# Patient Record
Sex: Male | Born: 1950 | Race: White | Hispanic: No | Marital: Married | Smoking: Former smoker
Health system: Southern US, Community
[De-identification: ages and names within clinical notes are randomized; demographics above are authoritative.]

## PROBLEM LIST (undated history)

## (undated) DIAGNOSIS — I10 Essential (primary) hypertension: Secondary | ICD-10-CM

## (undated) DIAGNOSIS — E7800 Pure hypercholesterolemia, unspecified: Secondary | ICD-10-CM

---

## 1999-05-31 HISTORY — PX: SHOULDER CLOSED REDUCTION: SHX1051

## 2010-10-07 ENCOUNTER — Other Ambulatory Visit (HOSPITAL_COMMUNITY): Payer: Self-pay | Admitting: Internal Medicine

## 2010-10-07 ENCOUNTER — Ambulatory Visit (HOSPITAL_COMMUNITY)
Admission: RE | Admit: 2010-10-07 | Discharge: 2010-10-07 | Disposition: A | Discharge status: Home / Self Care | Payer: PRIVATE HEALTH INSURANCE | Source: Ambulatory Visit | Attending: Internal Medicine | Admitting: Internal Medicine

## 2010-10-07 DIAGNOSIS — R079 Chest pain, unspecified: Secondary | ICD-10-CM | POA: Insufficient documentation

## 2010-10-07 DIAGNOSIS — I10 Essential (primary) hypertension: Secondary | ICD-10-CM | POA: Insufficient documentation

## 2010-10-07 DIAGNOSIS — R918 Other nonspecific abnormal finding of lung field: Secondary | ICD-10-CM | POA: Insufficient documentation

## 2011-04-15 ENCOUNTER — Telehealth: Payer: Self-pay

## 2011-04-15 ENCOUNTER — Other Ambulatory Visit: Payer: Self-pay

## 2011-04-15 DIAGNOSIS — R195 Other fecal abnormalities: Secondary | ICD-10-CM

## 2011-04-15 NOTE — Telephone Encounter (Signed)
Ok as is

## 2011-04-15 NOTE — Telephone Encounter (Signed)
Gastroenterology Pre-Procedure Form    Request Date: 04/15/2011       Requesting Physician: Dr. Ouida Sills     PATIENT INFORMATION:  Jeremy Sweeney is a 60 y.o., male (DOB=13-Jan-1951).  PROCEDURE: Procedure(s) requested: colonoscopy Procedure Reason: HEME POSITIVE  PATIENT REVIEW QUESTIONS: The patient reports the following:   1. Diabetes Melitis: no 2. Joint replacements in the past 12 months: no 3. Major health problems in the past 3 months: no 4. Has an artificial valve or MVP:no 5. Has been advised in past to take antibiotics in advance of a procedure like teeth cleaning: no}    MEDICATIONS & ALLERGIES:    Patient reports the following regarding taking any blood thinners:   Plavix? no Aspirin ? Yes Coumadin?  no  Patient confirms/reports the following medications:  Current Outpatient Prescriptions  Medication Sig Dispense Refill  . aspirin 81 MG tablet Take 81 mg by mouth daily.        Marland Kitchen losartan (COZAAR) 100 MG tablet Take 100 mg by mouth daily.        . naproxen sodium (ANAPROX) 220 MG tablet Take 220 mg by mouth 2 (two) times daily with a meal. Only as needed       . penicillin v potassium (VEETID) 500 MG tablet Take 500 mg by mouth 4 (four) times daily.        . simvastatin (ZOCOR) 10 MG tablet Take 10 mg by mouth at bedtime.        . NON FORMULARY LORTAB  7.5/750    AS NEEDED FROM THE DENTIST ( PT SAYS HE HAS ONLY TAKEN A COUPLE)         Patient confirms/reports the following allergies:  No Known Allergies  Patient is appropriate to schedule for requested procedure(s): yes  AUTHORIZATION INFORMATION Primary Insurance:   ID #:  Group #:  Pre-Cert / Auth required: Pre-Cert / Auth #:   Secondary Insurance:   ID #:   Group #:  Pre-Cert / Auth required:  Pre-Cert / Auth #:   No orders of the defined types were placed in this encounter.    SCHEDULE INFORMATION: Procedure has been scheduled as follows:  Date: 04/20/2011       Time: 2:15 PM  Location: [facility] Short Stay  This Gastroenterology Pre-Precedure Form is being routed to the following provider(s) for review: R. Roetta Sessions, MD   Rx and instructions faxed to [facility]/ Pt aware

## 2011-04-18 ENCOUNTER — Encounter (HOSPITAL_COMMUNITY): Payer: Self-pay | Admitting: Pharmacy Technician

## 2011-04-19 MED ORDER — SODIUM CHLORIDE 0.45 % IV SOLN
Freq: Once | INTRAVENOUS | Status: AC
  Administered 2011-04-20: 15:00:00 via INTRAVENOUS

## 2011-04-20 ENCOUNTER — Encounter (HOSPITAL_COMMUNITY): Payer: Self-pay

## 2011-04-20 ENCOUNTER — Encounter (HOSPITAL_COMMUNITY)
Admission: RE | Disposition: A | Discharge status: Home / Self Care | Payer: Self-pay | Source: Ambulatory Visit | Attending: Internal Medicine

## 2011-04-20 ENCOUNTER — Ambulatory Visit (HOSPITAL_COMMUNITY)
Admission: RE | Admit: 2011-04-20 | Discharge: 2011-04-20 | Disposition: A | Discharge status: Home / Self Care | Payer: No Typology Code available for payment source | Source: Ambulatory Visit | Attending: Internal Medicine | Admitting: Internal Medicine

## 2011-04-20 ENCOUNTER — Other Ambulatory Visit: Payer: Self-pay | Admitting: Internal Medicine

## 2011-04-20 DIAGNOSIS — D129 Benign neoplasm of anus and anal canal: Secondary | ICD-10-CM

## 2011-04-20 DIAGNOSIS — R195 Other fecal abnormalities: Secondary | ICD-10-CM

## 2011-04-20 DIAGNOSIS — Z79899 Other long term (current) drug therapy: Secondary | ICD-10-CM | POA: Insufficient documentation

## 2011-04-20 DIAGNOSIS — I10 Essential (primary) hypertension: Secondary | ICD-10-CM | POA: Insufficient documentation

## 2011-04-20 DIAGNOSIS — K5730 Diverticulosis of large intestine without perforation or abscess without bleeding: Secondary | ICD-10-CM

## 2011-04-20 DIAGNOSIS — D128 Benign neoplasm of rectum: Secondary | ICD-10-CM

## 2011-04-20 DIAGNOSIS — K921 Melena: Secondary | ICD-10-CM | POA: Insufficient documentation

## 2011-04-20 DIAGNOSIS — K648 Other hemorrhoids: Secondary | ICD-10-CM | POA: Insufficient documentation

## 2011-04-20 DIAGNOSIS — D126 Benign neoplasm of colon, unspecified: Secondary | ICD-10-CM

## 2011-04-20 HISTORY — DX: Essential (primary) hypertension: I10

## 2011-04-20 HISTORY — PX: COLONOSCOPY: SHX5424

## 2011-04-20 LAB — CBC
HCT: 43.3 % (ref 39.0–52.0)
MCH: 26.6 pg (ref 26.0–34.0)
MCV: 81.5 fL (ref 78.0–100.0)
RBC: 5.31 MIL/uL (ref 4.22–5.81)
RDW: 13.2 % (ref 11.5–15.5)
WBC: 10.1 10*3/uL (ref 4.0–10.5)

## 2011-04-20 SURGERY — COLONOSCOPY
Anesthesia: Moderate Sedation

## 2011-04-20 MED ORDER — MIDAZOLAM HCL 5 MG/5ML IJ SOLN
INTRAMUSCULAR | Status: DC | PRN
  Administered 2011-04-20: 1 mg via INTRAVENOUS
  Administered 2011-04-20: 2 mg via INTRAVENOUS
  Administered 2011-04-20: 1 mg via INTRAVENOUS

## 2011-04-20 MED ORDER — MEPERIDINE HCL 100 MG/ML IJ SOLN
INTRAMUSCULAR | Status: DC | PRN
  Administered 2011-04-20: 50 mg via INTRAVENOUS
  Administered 2011-04-20 (×2): 25 mg via INTRAVENOUS

## 2011-04-20 MED ORDER — SIMETHICONE 40 MG/0.6ML PO SUSP
ORAL | Status: DC | PRN
  Administered 2011-04-20: 15:00:00

## 2011-04-20 MED ORDER — MEPERIDINE HCL 100 MG/ML IJ SOLN
INTRAMUSCULAR | Status: AC
  Filled 2011-04-20: qty 2

## 2011-04-20 MED ORDER — MIDAZOLAM HCL 5 MG/5ML IJ SOLN
INTRAMUSCULAR | Status: AC
  Filled 2011-04-20: qty 10

## 2011-04-20 NOTE — H&P (Signed)
  Primary Care Physician:  Carylon Perches, MD Primary Gastroenterologist:  Dr.   Pre-Procedure History & Physical: HPI:  Jeremy Sweeney is a 60 y.o. male here for colonoscopy given he is strongly Hemoccult positive stool last week in Dr. Alonza Smoker office. Patient has not seen any rectal bleeding or melena in fact, he's devoid any GI symptoms. Specifically, no abdominal pain or change in bowel habits no upper GI tract symptoms such as odynaphagia , dysphagia, early satiety, nausea,  vomiting or reflux symptoms. No prior colonoscopy. No family history of GI malignancy including colon cancer colon polyps. No prior history of any gastrointestinal disorders per patient report. Takes occasional Naprosyn and daily 81 mg aspirin.  Past Medical History  Diagnosis Date  . Hypertension     Past Surgical History  Procedure Date  . Shoulder closed reduction     Prior to Admission medications   Medication Sig Start Date End Date Taking? Authorizing Provider  aspirin EC 81 MG tablet Take 81 mg by mouth daily.     Yes Historical Provider, MD  HYDROcodone-acetaminophen (VICODIN ES) 7.5-750 MG per tablet Take 1 tablet by mouth every 6 (six) hours as needed. pain    Yes Historical Provider, MD  losartan (COZAAR) 100 MG tablet Take 100 mg by mouth daily.     Yes Historical Provider, MD  penicillin v potassium (VEETID) 500 MG tablet Take 500 mg by mouth 4 (four) times daily.     Yes Historical Provider, MD  simvastatin (ZOCOR) 10 MG tablet Take 10 mg by mouth at bedtime.     Yes Historical Provider, MD  naproxen sodium (ANAPROX) 220 MG tablet Take 220 mg by mouth 2 (two) times daily with a meal. For pain    Historical Provider, MD    Allergies as of 04/15/2011  . (No Known Allergies)    History reviewed. No pertinent family history.  History   Social History  . Marital Status: Unknown    Spouse Name: N/A    Number of Children: N/A  . Years of Education: N/A   Occupational History  . Not on file.    Social History Main Topics  . Smoking status: Former Games developer  . Smokeless tobacco: Not on file  . Alcohol Use: Yes  . Drug Use: No  . Sexually Active:    Other Topics Concern  . Not on file   Social History Narrative  . No narrative on file    Review of Systems: See HPI, otherwise negative ROS  Physical Exam: BP 137/82  Pulse 106  Temp(Src) 97.9 F (36.6 C) (Oral)  Resp 22  Ht 5' 7.5" (1.715 m)  Wt 180 lb (81.647 kg)  BMI 27.78 kg/m2  SpO2 98% General:   Alert,  Well-developed, well-nourished, pleasant and cooperative in NAD Head:  Normocephalic and atraumatic. Mouth:  No deformity or lesions, dentition normal. Neck:  Supple; no masses or thyromegaly. Lungs:  Clear throughout to auscultation.   No wheezes, crackles, or rhonchi. No acute distress. Heart:  Regular rate and rhythm; no murmurs, clicks, rubs,  or gallops. Abdomen:   Positive bowel sounds. Soft nontender without appreciable mass or organomegaly     Impression/Plan:    60 year old gentleman Hemoccult positive stool; otherwise no GI symptoms. No prior colonoscopy.  Recommendations: Diagnostic colonoscopy today.  Risks, benefits, limitations, alternatives and imponderables have been reviewed with the patient. Questions have been answered. All parties are agreeable.

## 2011-04-26 NOTE — Progress Notes (Signed)
Quick Note:  Tried to call pt- LMOM ______ 

## 2011-04-27 NOTE — Progress Notes (Signed)
Results Cc to PCP  

## 2011-04-28 ENCOUNTER — Encounter (HOSPITAL_COMMUNITY): Payer: Self-pay | Admitting: Internal Medicine

## 2011-05-03 ENCOUNTER — Encounter: Payer: Self-pay | Admitting: Internal Medicine

## 2015-04-27 DIAGNOSIS — E119 Type 2 diabetes mellitus without complications: Secondary | ICD-10-CM | POA: Diagnosis not present

## 2015-04-27 DIAGNOSIS — Z79899 Other long term (current) drug therapy: Secondary | ICD-10-CM | POA: Diagnosis not present

## 2015-04-27 DIAGNOSIS — E785 Hyperlipidemia, unspecified: Secondary | ICD-10-CM | POA: Diagnosis not present

## 2015-04-27 DIAGNOSIS — I10 Essential (primary) hypertension: Secondary | ICD-10-CM | POA: Diagnosis not present

## 2015-05-05 DIAGNOSIS — I10 Essential (primary) hypertension: Secondary | ICD-10-CM | POA: Diagnosis not present

## 2015-05-05 DIAGNOSIS — Z23 Encounter for immunization: Secondary | ICD-10-CM | POA: Diagnosis not present

## 2015-05-05 DIAGNOSIS — R7301 Impaired fasting glucose: Secondary | ICD-10-CM | POA: Diagnosis not present

## 2015-05-05 DIAGNOSIS — E785 Hyperlipidemia, unspecified: Secondary | ICD-10-CM | POA: Diagnosis not present

## 2015-05-05 DIAGNOSIS — Z6825 Body mass index (BMI) 25.0-25.9, adult: Secondary | ICD-10-CM | POA: Diagnosis not present

## 2015-09-10 DIAGNOSIS — N23 Unspecified renal colic: Secondary | ICD-10-CM | POA: Diagnosis not present

## 2015-09-10 DIAGNOSIS — Z6825 Body mass index (BMI) 25.0-25.9, adult: Secondary | ICD-10-CM | POA: Diagnosis not present

## 2015-09-17 ENCOUNTER — Other Ambulatory Visit (HOSPITAL_COMMUNITY): Payer: Self-pay | Admitting: Internal Medicine

## 2015-09-17 DIAGNOSIS — R319 Hematuria, unspecified: Secondary | ICD-10-CM | POA: Diagnosis not present

## 2015-09-17 DIAGNOSIS — N23 Unspecified renal colic: Secondary | ICD-10-CM | POA: Diagnosis not present

## 2015-09-18 ENCOUNTER — Ambulatory Visit (HOSPITAL_COMMUNITY)
Admission: RE | Admit: 2015-09-18 | Discharge: 2015-09-18 | Disposition: A | Discharge status: Home / Self Care | Payer: Medicare HMO | Source: Ambulatory Visit | Attending: Internal Medicine | Admitting: Internal Medicine

## 2015-09-18 DIAGNOSIS — N400 Enlarged prostate without lower urinary tract symptoms: Secondary | ICD-10-CM | POA: Diagnosis not present

## 2015-09-18 DIAGNOSIS — N1330 Unspecified hydronephrosis: Secondary | ICD-10-CM | POA: Insufficient documentation

## 2015-09-18 DIAGNOSIS — R109 Unspecified abdominal pain: Secondary | ICD-10-CM | POA: Insufficient documentation

## 2015-09-18 DIAGNOSIS — N132 Hydronephrosis with renal and ureteral calculous obstruction: Secondary | ICD-10-CM | POA: Diagnosis not present

## 2015-09-18 DIAGNOSIS — N23 Unspecified renal colic: Secondary | ICD-10-CM

## 2015-09-18 DIAGNOSIS — K5730 Diverticulosis of large intestine without perforation or abscess without bleeding: Secondary | ICD-10-CM | POA: Insufficient documentation

## 2015-09-18 DIAGNOSIS — R319 Hematuria, unspecified: Secondary | ICD-10-CM

## 2015-09-18 DIAGNOSIS — N201 Calculus of ureter: Secondary | ICD-10-CM | POA: Diagnosis not present

## 2015-09-18 DIAGNOSIS — N200 Calculus of kidney: Secondary | ICD-10-CM | POA: Insufficient documentation

## 2015-09-24 ENCOUNTER — Other Ambulatory Visit (HOSPITAL_COMMUNITY): Payer: Self-pay | Admitting: Internal Medicine

## 2015-09-24 DIAGNOSIS — R918 Other nonspecific abnormal finding of lung field: Secondary | ICD-10-CM

## 2015-09-30 ENCOUNTER — Ambulatory Visit (HOSPITAL_COMMUNITY)
Admission: RE | Admit: 2015-09-30 | Discharge: 2015-09-30 | Disposition: A | Discharge status: Home / Self Care | Payer: Medicare HMO | Source: Ambulatory Visit | Attending: Internal Medicine | Admitting: Internal Medicine

## 2015-09-30 DIAGNOSIS — R918 Other nonspecific abnormal finding of lung field: Secondary | ICD-10-CM

## 2015-09-30 DIAGNOSIS — I2510 Atherosclerotic heart disease of native coronary artery without angina pectoris: Secondary | ICD-10-CM | POA: Insufficient documentation

## 2015-10-09 ENCOUNTER — Ambulatory Visit (INDEPENDENT_AMBULATORY_CARE_PROVIDER_SITE_OTHER): Payer: Medicare HMO | Admitting: Urology

## 2015-10-09 ENCOUNTER — Other Ambulatory Visit: Payer: Self-pay | Admitting: Urology

## 2015-10-09 ENCOUNTER — Ambulatory Visit (HOSPITAL_COMMUNITY)
Admission: RE | Admit: 2015-10-09 | Discharge: 2015-10-09 | Disposition: A | Discharge status: Home / Self Care | Payer: Medicare HMO | Source: Ambulatory Visit | Attending: Urology | Admitting: Urology

## 2015-10-09 DIAGNOSIS — N201 Calculus of ureter: Secondary | ICD-10-CM | POA: Diagnosis not present

## 2015-10-09 DIAGNOSIS — Z87442 Personal history of urinary calculi: Secondary | ICD-10-CM

## 2015-10-09 DIAGNOSIS — R109 Unspecified abdominal pain: Secondary | ICD-10-CM | POA: Diagnosis not present

## 2015-10-28 ENCOUNTER — Other Ambulatory Visit: Payer: Self-pay | Admitting: Urology

## 2015-10-28 ENCOUNTER — Ambulatory Visit (INDEPENDENT_AMBULATORY_CARE_PROVIDER_SITE_OTHER): Payer: Medicare HMO | Admitting: Urology

## 2015-10-28 ENCOUNTER — Ambulatory Visit (HOSPITAL_COMMUNITY)
Admission: RE | Admit: 2015-10-28 | Discharge: 2015-10-28 | Disposition: A | Discharge status: Home / Self Care | Payer: Medicare HMO | Source: Ambulatory Visit | Attending: Urology | Admitting: Urology

## 2015-10-28 DIAGNOSIS — N200 Calculus of kidney: Secondary | ICD-10-CM | POA: Diagnosis not present

## 2015-10-28 DIAGNOSIS — N201 Calculus of ureter: Secondary | ICD-10-CM | POA: Insufficient documentation

## 2015-11-02 ENCOUNTER — Other Ambulatory Visit: Payer: Self-pay

## 2015-11-02 DIAGNOSIS — N201 Calculus of ureter: Secondary | ICD-10-CM

## 2015-11-04 ENCOUNTER — Telehealth: Payer: Self-pay | Admitting: Urology

## 2015-11-04 NOTE — Telephone Encounter (Signed)
Pt advised of pre op date/time and sx date. Pre op: 11/09/15 @ 9:00am Sx: 11/11/15 with Dr Alyson Ingles @ [facility]. Post op: 11/25/15 @ 10:30am  No authorization is required for CPT: [id number] with [employer] [phone]) REF# [id number].

## 2015-11-05 DIAGNOSIS — R911 Solitary pulmonary nodule: Secondary | ICD-10-CM | POA: Diagnosis not present

## 2015-11-05 DIAGNOSIS — I10 Essential (primary) hypertension: Secondary | ICD-10-CM | POA: Diagnosis not present

## 2015-11-05 DIAGNOSIS — I714 Abdominal aortic aneurysm, without rupture: Secondary | ICD-10-CM | POA: Diagnosis not present

## 2015-11-06 NOTE — Patient Instructions (Signed)
Jeremy Sweeney  11/06/2015     @PREFPERIOPPHARMACY @   Your procedure is scheduled on 11/11/2015.  Report to [facility] at 12:30 A.M.  Call this number if you have problems the morning of surgery:  [phone]   Remember:  Do not eat food or drink liquids after midnight.  Take these medicines the morning of surgery with A SIP OF WATER Losartan, Flomax   Do not wear jewelry, make-up or nail polish.  Do not wear lotions, powders, or perfumes.  You may wear deodorant.  Do not shave 48 hours prior to surgery.  Men may shave face and neck.  Do not bring valuables to the hospital.  [facility] is not responsible for any belongings or valuables.  Contacts, dentures or bridgework may not be worn into surgery.  Leave your suitcase in the car.  After surgery it may be brought to your room.  For patients admitted to the hospital, discharge time will be determined by your treatment team.  Patients discharged the day of surgery will not be allowed to drive home.    Please read over the following fact sheets that you were given. Surgical Site Infection Prevention and Anesthesia Post-op Instructions     PATIENT INSTRUCTIONS POST-ANESTHESIA  IMMEDIATELY FOLLOWING SURGERY:  Do not drive or operate machinery for the first twenty four hours after surgery.  Do not make any important decisions for twenty four hours after surgery or while taking narcotic pain medications or sedatives.  If you develop intractable nausea and vomiting or a severe headache please notify your doctor immediately.  FOLLOW-UP:  Please make an appointment with your surgeon as instructed. You do not need to follow up with anesthesia unless specifically instructed to do so.  WOUND CARE INSTRUCTIONS (if applicable):  Keep a dry clean dressing on the anesthesia/puncture wound site if there is drainage.  Once the wound has quit draining you may leave it open to air.  Generally you should leave the bandage intact for twenty  four hours unless there is drainage.  If the epidural site drains for more than 36-48 hours please call the anesthesia department.  QUESTIONS?:  Please feel free to call your physician or the hospital operator if you have any questions, and they will be happy to assist you.      Ureteral Stent Implantation Ureteral stent implantation is the implantation of a soft plastic tube with multiple holes into the tube that drains urine from your kidney to your bladder (ureter). The stent helps drain your kidney when there is a blockage of the flow of urine in your ureter. The stent has a coil on each end to keep it from falling out. One end stays in the kidney. The other end stays in the bladder. It is most often taken out after any blockage has been removed or your ureter has healed. Short-term stents have a string attached to make removal quite easy. Removal of a short-term stent can be done in your health care provider's office or by you at home. Long-term stents need to be changed every few months. LET [facility] CARE PROVIDER KNOW ABOUT:  Any allergies you have.  All medicines you are taking, including vitamins, herbs, eye drops, creams, and over-the-counter medicines.  Previous problems you or members of your family have had with the use of anesthetics.  Any blood disorders you have.  Previous surgeries you have had.  Medical conditions you have. RISKS AND COMPLICATIONS Generally, ureteral stent implantation is a  safe procedure. However, as with any procedure, complications can occur. Possible complications include:  Movement of the stent away from where it was originally placed (migration). This may affect the ability of the stent to properly drain your kidney. If migration of the stent occurs, the stent may need to be replaced or repositioned.  Perforation of the ureter.  Infection. BEFORE THE PROCEDURE  You may be asked to wash your genital area with sterile soap the morning of your  procedure.  You may be given an oral antibiotic which you should take with a sip of water as prescribed by your health care provider.  You may be asked to not eat or drink for 8 hours before the surgery. PROCEDURE  First you will be given an anesthetic so you do not feel pain during the procedure.  Your health care provider will insert a special lighted instrument called a cystoscope into your bladder. This allows your health care provider to see the opening to your ureter.  A thin wire is carefully threaded into your bladder and up the ureter. The stent is inserted over the wire and the wire is then removed.  Your bladder will be emptied of urine. AFTER THE PROCEDURE You will be taken to a recovery room until it is okay for you to go home.   This information is not intended to replace advice given to you by your health care provider. Make sure you discuss any questions you have with your health care provider.   Document Released: 05/13/2000 Document Revised: 05/21/2013 Document Reviewed: 11/28/2014 Elsevier Interactive Patient Education [employer]. Cystoscopy Cystoscopy is a procedure that is used to help your caregiver diagnose and sometimes treat conditions that affect your lower urinary tract. Your lower urinary tract includes your bladder and the tube through which urine passes from your bladder out of your body (urethra). Cystoscopy is performed with a thin, tube-shaped instrument (cystoscope). The cystoscope has lenses and a light at the end so that your caregiver can see inside your bladder. The cystoscope is inserted at the entrance of your urethra. Your caregiver guides it through your urethra and into your bladder. There are two main types of cystoscopy:  Flexible cystoscopy (with a flexible cystoscope).  Rigid cystoscopy (with a rigid cystoscope). Cystoscopy may be recommended for many conditions, including:  Urinary tract infections.  Blood in your urine  (hematuria).  Loss of bladder control (urinary incontinence) or overactive bladder.  Unusual cells found in a urine sample.  Urinary blockage.  Painful urination. Cystoscopy may also be done to remove a sample of your tissue to be checked under a microscope (biopsy). It may also be done to remove or destroy bladder stones. LET YOUR CAREGIVER KNOW ABOUT:  Allergies to food or medicine.  Medicines taken, including vitamins, herbs, eyedrops, over-the-counter medicines, and creams.  Use of steroids (by mouth or creams).  Previous problems with anesthetics or numbing medicines.  History of bleeding problems or blood clots.  Previous surgery.  Other health problems, including diabetes and kidney problems.  Possibility of pregnancy, if this applies. PROCEDURE The area around the opening to your urethra will be cleaned. A medicine to numb your urethra (local anesthetic) is used. If a tissue sample or stone is removed during the procedure, you may be given a medicine to make you sleep (general anesthetic). Your caregiver will gently insert the tip of the cystoscope into your urethra. The cystoscope will be slowly glided through your urethra and into your bladder. Sterile  fluid will flow through the cystoscope and into your bladder. The fluid will expand and stretch your bladder. This gives your caregiver a better view of your bladder walls. The procedure lasts about 15-20 minutes. AFTER THE PROCEDURE If a local anesthetic is used, you will be allowed to go home as soon as you are ready. If a general anesthetic is used, you will be taken to a recovery area until you are stable. You may have temporary bleeding and burning on urination.   This information is not intended to replace advice given to you by your health care provider. Make sure you discuss any questions you have with your health care provider.   Document Released: 05/13/2000 Document Revised: 06/06/2014 Document Reviewed:  11/07/2011 Elsevier Interactive Patient Education [employer].

## 2015-11-09 ENCOUNTER — Encounter (HOSPITAL_COMMUNITY): Payer: Self-pay

## 2015-11-09 ENCOUNTER — Encounter (HOSPITAL_COMMUNITY)
Admission: RE | Admit: 2015-11-09 | Discharge: 2015-11-09 | Disposition: A | Discharge status: Home / Self Care | Payer: Medicare HMO | Source: Ambulatory Visit | Attending: Urology | Admitting: Urology

## 2015-11-09 ENCOUNTER — Other Ambulatory Visit: Payer: Self-pay

## 2015-11-09 DIAGNOSIS — I10 Essential (primary) hypertension: Secondary | ICD-10-CM | POA: Diagnosis not present

## 2015-11-09 DIAGNOSIS — N201 Calculus of ureter: Secondary | ICD-10-CM | POA: Diagnosis not present

## 2015-11-09 DIAGNOSIS — Z01812 Encounter for preprocedural laboratory examination: Secondary | ICD-10-CM | POA: Diagnosis not present

## 2015-11-09 DIAGNOSIS — Z87891 Personal history of nicotine dependence: Secondary | ICD-10-CM | POA: Diagnosis not present

## 2015-11-09 DIAGNOSIS — Z01810 Encounter for preprocedural cardiovascular examination: Secondary | ICD-10-CM | POA: Diagnosis not present

## 2015-11-09 HISTORY — DX: Pure hypercholesterolemia, unspecified: E78.00

## 2015-11-09 LAB — CBC WITH DIFFERENTIAL/PLATELET
BASOS PCT: 1 %
Basophils Absolute: 0.1 10*3/uL (ref 0.0–0.1)
Eosinophils Absolute: 0.2 10*3/uL (ref 0.0–0.7)
Eosinophils Relative: 3 %
HEMATOCRIT: 37.9 % — AB (ref 39.0–52.0)
Hemoglobin: 12.6 g/dL — ABNORMAL LOW (ref 13.0–17.0)
LYMPHS ABS: 1.4 10*3/uL (ref 0.7–4.0)
Lymphocytes Relative: 22 %
MCH: 27.4 pg (ref 26.0–34.0)
MCHC: 33.2 g/dL (ref 30.0–36.0)
MCV: 82.4 fL (ref 78.0–100.0)
MONO ABS: 0.3 10*3/uL (ref 0.1–1.0)
MONOS PCT: 5 %
NEUTROS ABS: 4.2 10*3/uL (ref 1.7–7.7)
Neutrophils Relative %: 69 %
Platelets: 188 10*3/uL (ref 150–400)
RBC: 4.6 MIL/uL (ref 4.22–5.81)
RDW: 13.3 % (ref 11.5–15.5)
WBC: 6.2 10*3/uL (ref 4.0–10.5)

## 2015-11-09 LAB — BASIC METABOLIC PANEL
Anion gap: 3 — ABNORMAL LOW (ref 5–15)
BUN: 20 mg/dL (ref 6–20)
CALCIUM: 9.5 mg/dL (ref 8.9–10.3)
CHLORIDE: 109 mmol/L (ref 101–111)
CO2: 28 mmol/L (ref 22–32)
CREATININE: 1.15 mg/dL (ref 0.61–1.24)
GFR calc Af Amer: >60 mL/min (ref >60)
GFR calc non Af Amer: >60 mL/min (ref >60)
GLUCOSE: 113 mg/dL — AB (ref 65–99)
Potassium: 4.1 mmol/L (ref 3.5–5.1)
Sodium: 140 mmol/L (ref 135–145)

## 2015-11-09 NOTE — Pre-Procedure Instructions (Signed)
Patient given information to sign up for my chart at home. 

## 2015-11-11 ENCOUNTER — Encounter (HOSPITAL_COMMUNITY): Payer: Self-pay | Admitting: *Deleted

## 2015-11-11 ENCOUNTER — Ambulatory Visit (HOSPITAL_COMMUNITY)
Admission: RE | Admit: 2015-11-11 | Discharge: 2015-11-11 | Disposition: A | Discharge status: Home / Self Care | Payer: Medicare HMO | Source: Ambulatory Visit | Attending: Urology | Admitting: Urology

## 2015-11-11 ENCOUNTER — Ambulatory Visit (HOSPITAL_COMMUNITY): Payer: Medicare HMO | Admitting: Anesthesiology

## 2015-11-11 ENCOUNTER — Encounter (HOSPITAL_COMMUNITY)
Admission: RE | Disposition: A | Discharge status: Home / Self Care | Payer: Self-pay | Source: Ambulatory Visit | Attending: Urology

## 2015-11-11 ENCOUNTER — Ambulatory Visit (HOSPITAL_COMMUNITY): Payer: Medicare HMO

## 2015-11-11 DIAGNOSIS — I10 Essential (primary) hypertension: Secondary | ICD-10-CM | POA: Insufficient documentation

## 2015-11-11 DIAGNOSIS — Z01812 Encounter for preprocedural laboratory examination: Secondary | ICD-10-CM | POA: Diagnosis not present

## 2015-11-11 DIAGNOSIS — Z01810 Encounter for preprocedural cardiovascular examination: Secondary | ICD-10-CM | POA: Insufficient documentation

## 2015-11-11 DIAGNOSIS — Z87891 Personal history of nicotine dependence: Secondary | ICD-10-CM | POA: Diagnosis not present

## 2015-11-11 DIAGNOSIS — N201 Calculus of ureter: Secondary | ICD-10-CM

## 2015-11-11 DIAGNOSIS — N200 Calculus of kidney: Secondary | ICD-10-CM | POA: Diagnosis not present

## 2015-11-11 HISTORY — PX: CYSTOSCOPY WITH STENT PLACEMENT: SHX5790

## 2015-11-11 HISTORY — PX: CYSTOSCOPY/RETROGRADE/URETEROSCOPY/STONE EXTRACTION WITH BASKET: SHX5317

## 2015-11-11 SURGERY — CYSTOSCOPY, WITH CALCULUS REMOVAL USING BASKET
Anesthesia: General

## 2015-11-11 MED ORDER — STERILE WATER FOR IRRIGATION IR SOLN
Status: DC | PRN
  Administered 2015-11-11: 3000 mL

## 2015-11-11 MED ORDER — DIATRIZOATE MEGLUMINE 30 % UR SOLN
URETHRAL | Status: AC
Start: 2015-11-11 — End: 2015-11-11
  Filled 2015-11-11: qty 300

## 2015-11-11 MED ORDER — EPHEDRINE SULFATE 50 MG/ML IJ SOLN
INTRAMUSCULAR | Status: DC | PRN
  Administered 2015-11-11 (×5): 10 mg via INTRAVENOUS

## 2015-11-11 MED ORDER — FENTANYL CITRATE (PF) 100 MCG/2ML IJ SOLN
INTRAMUSCULAR | Status: AC
  Filled 2015-11-11: qty 2

## 2015-11-11 MED ORDER — MIDAZOLAM HCL 2 MG/2ML IJ SOLN
1.0000 mg | INTRAMUSCULAR | Status: DC | PRN
  Administered 2015-11-11: 2 mg via INTRAVENOUS

## 2015-11-11 MED ORDER — CEFAZOLIN SODIUM 1-5 GM-% IV SOLN
1.0000 g | INTRAVENOUS | Status: AC
  Administered 2015-11-11: 1 g via INTRAVENOUS
  Filled 2015-11-11: qty 50

## 2015-11-11 MED ORDER — FENTANYL CITRATE (PF) 100 MCG/2ML IJ SOLN
25.0000 ug | INTRAMUSCULAR | Status: AC | PRN
Start: 2015-11-11 — End: 2015-11-11
  Administered 2015-11-11 (×2): 25 ug via INTRAVENOUS

## 2015-11-11 MED ORDER — ONDANSETRON HCL 4 MG/2ML IJ SOLN: 4.0000 mg | Freq: Once | INTRAMUSCULAR | Status: DC | PRN

## 2015-11-11 MED ORDER — EPHEDRINE SULFATE 50 MG/ML IJ SOLN
INTRAMUSCULAR | Status: AC
  Filled 2015-11-11: qty 1

## 2015-11-11 MED ORDER — PROPOFOL 10 MG/ML IV BOLUS
INTRAVENOUS | Status: DC | PRN
  Administered 2015-11-11: 150 mg via INTRAVENOUS

## 2015-11-11 MED ORDER — LIDOCAINE HCL (PF) 1 % IJ SOLN
INTRAMUSCULAR | Status: AC
  Filled 2015-11-11: qty 5

## 2015-11-11 MED ORDER — FENTANYL CITRATE (PF) 100 MCG/2ML IJ SOLN: 25.0000 ug | INTRAMUSCULAR | Status: DC | PRN

## 2015-11-11 MED ORDER — MIDAZOLAM HCL 2 MG/2ML IJ SOLN
INTRAMUSCULAR | Status: AC
  Filled 2015-11-11: qty 2

## 2015-11-11 MED ORDER — DIATRIZOATE MEGLUMINE 30 % UR SOLN
URETHRAL | Status: DC | PRN
  Administered 2015-11-11: 100 mL

## 2015-11-11 MED ORDER — LACTATED RINGERS IV SOLN
INTRAVENOUS | Status: DC
  Administered 2015-11-11 (×2): via INTRAVENOUS

## 2015-11-11 MED ORDER — ONDANSETRON HCL 4 MG/2ML IJ SOLN
4.0000 mg | Freq: Once | INTRAMUSCULAR | Status: AC
  Administered 2015-11-11: 4 mg via INTRAVENOUS

## 2015-11-11 MED ORDER — LIDOCAINE HCL (CARDIAC) 10 MG/ML IV SOLN
INTRAVENOUS | Status: DC | PRN
  Administered 2015-11-11: 50 mg via INTRAVENOUS

## 2015-11-11 MED ORDER — PHENAZOPYRIDINE HCL 100 MG PO TABS: 100.0000 mg | ORAL_TABLET | Freq: Three times a day (TID) | ORAL | Status: DC | PRN

## 2015-11-11 MED ORDER — ONDANSETRON HCL 4 MG/2ML IJ SOLN
INTRAMUSCULAR | Status: AC
Start: 2015-11-11 — End: 2015-11-11
  Filled 2015-11-11: qty 2

## 2015-11-11 MED ORDER — OXYCODONE-ACETAMINOPHEN 5-325 MG PO TABS: 1.0000 | ORAL_TABLET | ORAL | Status: DC | PRN

## 2015-11-11 MED ORDER — FENTANYL CITRATE (PF) 100 MCG/2ML IJ SOLN
INTRAMUSCULAR | Status: DC | PRN
  Administered 2015-11-11: 100 ug via INTRAVENOUS

## 2015-11-11 MED ORDER — SODIUM CHLORIDE 0.9 % IJ SOLN
INTRAMUSCULAR | Status: AC
  Filled 2015-11-11: qty 10

## 2015-11-11 MED ORDER — SODIUM CHLORIDE 0.9 % IR SOLN
Status: DC | PRN
  Administered 2015-11-11: 3000 mL

## 2015-11-11 SURGICAL SUPPLY — 24 items
BAG DRAIN URO TABLE W/ADPT NS (DRAPE) ×3 IMPLANT
BAG HAMPER (MISCELLANEOUS) ×3 IMPLANT
CATH INTERMIT  6FR 70CM (CATHETERS) ×3 IMPLANT
CLOTH BEACON ORANGE TIMEOUT ST (SAFETY) ×3 IMPLANT
EXTRACTOR STONE NITINOL NGAGE (UROLOGICAL SUPPLIES) ×3 IMPLANT
FIBER LASER FLEXIVA 200 (UROLOGICAL SUPPLIES) IMPLANT
FIBER LASER FLEXIVA 365 (UROLOGICAL SUPPLIES) IMPLANT
GLOVE BIO SURGEON STRL SZ8 (GLOVE) ×3 IMPLANT
GLOVE BIOGEL PI IND STRL 6.5 (GLOVE) ×2 IMPLANT
GLOVE BIOGEL PI INDICATOR 6.5 (GLOVE) ×1
GLOVE EXAM NITRILE MD LF STRL (GLOVE) ×3 IMPLANT
GOWN STRL REUS W/ TWL LRG LVL3 (GOWN DISPOSABLE) ×2 IMPLANT
GOWN STRL REUS W/ TWL XL LVL3 (GOWN DISPOSABLE) ×2 IMPLANT
GOWN STRL REUS W/TWL LRG LVL3 (GOWN DISPOSABLE) ×1
GOWN STRL REUS W/TWL XL LVL3 (GOWN DISPOSABLE) ×1
GUIDEWIRE ANG ZIPWIRE 038X150 (WIRE) ×3 IMPLANT
GUIDEWIRE STR DUAL SENSOR (WIRE) IMPLANT
IV NS IRRIG 3000ML ARTHROMATIC (IV SOLUTION) ×3 IMPLANT
MANIFOLD NEPTUNE II (INSTRUMENTS) ×3 IMPLANT
PACK CYSTO (CUSTOM PROCEDURE TRAY) ×3 IMPLANT
PAD ARMBOARD 7.5X6 YLW CONV (MISCELLANEOUS) ×3 IMPLANT
STENT URET 6FRX26 CONTOUR (STENTS) ×3 IMPLANT
SYRINGE 10CC LL (SYRINGE) ×3 IMPLANT
WATER STERILE IRR 3000ML UROMA (IV SOLUTION) ×3 IMPLANT

## 2015-11-11 NOTE — Transfer of Care (Signed)
Immediate Anesthesia Transfer of Care Note  Patient: Jeremy Sweeney  Procedure(s) Performed: Procedure(s): CYSTOSCOPY/RETROGRADE/URETEROSCOPY/STONE EXTRACTION WITH BASKET (Left) CYSTOSCOPY WITH STENT PLACEMENT (Left)  Patient Location: PACU  Anesthesia Type:General  Level of Consciousness: awake, alert , oriented and patient cooperative  Airway & Oxygen Therapy: Patient Spontanous Breathing and Patient connected to face mask oxygen  Post-op Assessment: Report given to RN, Post -op Vital signs reviewed and stable and Patient moving all extremities  Post vital signs: Reviewed and stable  Last Vitals:  Filed Vitals:   11/11/15 1455 11/11/15 1500  BP: 119/73   Resp: 24 15    Last Pain: There were no vitals filed for this visit.    Patients Stated Pain Goal: 5 ([phone] [id number])  Complications: No apparent anesthesia complications

## 2015-11-11 NOTE — H&P (Signed)
Urology Admission H&P  Chief Complaint: left flank pain  History of Present Illness: [name] is a 65yo with a hx of nephrolithiasis who has failed MET. He has a 52m L UVJ calculus. Mild intermittent pain. Mild urgency and dysuria. No hematuria  Past Medical History  Diagnosis Date  . Hypertension   . Hypercholesteremia    Past Surgical History  Procedure Laterality Date  . Shoulder closed reduction  2001  . Colonoscopy  04/20/2011    Procedure: COLONOSCOPY;  Surgeon: RDaneil Dolin MD;  Location: [facility];  Service: Endoscopy;  Laterality: N/A;  2:15 PM    Home Medications:  Prescriptions prior to admission  Medication Sig Dispense Refill Last Dose  . aspirin EC 81 MG tablet Take 81 mg by mouth daily.     11/08/2015  . losartan (COZAAR) 100 MG tablet Take 100 mg by mouth daily.     11/11/2015 at 0800  . simvastatin (ZOCOR) 10 MG tablet Take 10 mg by mouth at bedtime.     11/10/2015 at Unknown time  . tamsulosin (FLOMAX) 0.4 MG CAPS capsule Take 1 capsule by mouth daily.   11/11/2015 at 0800   Allergies: No Known Allergies  History reviewed. No pertinent family history. Social History:  reports that he quit smoking about 7 years ago. His smoking use included Cigarettes. He has a 20 pack-year smoking history. He does not have any smokeless tobacco history on file. He reports that he drinks alcohol. He reports that he does not use illicit drugs.  Review of Systems  Genitourinary: Positive for flank pain.  All other systems reviewed and are negative.   Physical Exam:  Vital signs in last 24 hours: Resp:  [11-35] 15 (06/14 1500) BP: (113-136)/(69-82) 119/73 mmHg (06/14 1455) SpO2:  [91 %-98 %] 91 % (06/14 1500) Physical Exam  Constitutional: He is oriented to person, place, and time. He appears well-developed and well-nourished.  HENT:  Head: Normocephalic and atraumatic.  Eyes: EOM are normal. Pupils are equal, round, and reactive to light.  Neck: Normal range of  motion. No thyromegaly present.  Cardiovascular: Normal rate and regular rhythm.   Respiratory: Effort normal. No respiratory distress.  GI: Soft. He exhibits no distension.  Musculoskeletal: Normal range of motion.  Neurological: He is alert and oriented to person, place, and time.  Skin: Skin is warm and dry.  Psychiatric: He has a normal mood and affect. His behavior is normal. Judgment and thought content normal.    Laboratory Data:  No results found for this or any previous visit (from the past 24 hour(s)). No results found for this or any previous visit (from the past 240 hour(s)). Creatinine:  Recent Labs  11/09/15 0915  CREATININE 1.15   Baseline Creatinine: 1.1  Impression/Assessment:  65yo with a distal left ureteral calculus  Plan:  The risks/benefits/alternatives to left ureteroscopic stone extraction was explained to the patient and he understands and wishes to proceed with surgery  PNicolette Bang6/14/2017, 3:26 PM

## 2015-11-11 NOTE — Brief Op Note (Signed)
11/11/2015  4:43 PM  PATIENT:  Jeremy Sweeney  65 y.o. male  PRE-OPERATIVE DIAGNOSIS:  left ureteral calculus  POST-OPERATIVE DIAGNOSIS:  left ureteral calculus  PROCEDURE:  Procedure(s): CYSTOSCOPY/RETROGRADE/URETEROSCOPY/STONE EXTRACTION WITH BASKET (Left) CYSTOSCOPY WITH STENT PLACEMENT (Left)  SURGEON:  Surgeon(s) and Role:    * Cleon Gustin, MD - Primary  PHYSICIAN ASSISTANT:   ASSISTANTS: none   ANESTHESIA:   general  EBL:  Total I/O In: 1000 [I.V.:1000] Out: -   BLOOD ADMINISTERED:none  DRAINS: left 6x26 JJ ureteral stetn without tether  LOCAL MEDICATIONS USED:  NONE  SPECIMEN:  Source of Specimen:  left ureteral calculus  DISPOSITION OF SPECIMEN:  PATHOLOGY  COUNTS:  YES  TOURNIQUET:  * No tourniquets in log *  DICTATION: .Note written in EPIC  PLAN OF CARE: Discharge to home after PACU  PATIENT DISPOSITION:  PACU - hemodynamically stable.   Delay start of Pharmacological VTE agent (>24hrs) due to surgical blood loss or risk of bleeding: not applicable

## 2015-11-11 NOTE — Anesthesia Postprocedure Evaluation (Signed)
Anesthesia Post Note  Patient: Jeremy Sweeney  Procedure(s) Performed: Procedure(s) (LRB): CYSTOSCOPY/RETROGRADE/URETEROSCOPY/STONE EXTRACTION WITH BASKET (Left) CYSTOSCOPY WITH STENT PLACEMENT (Left)  Patient location during evaluation: PACU Anesthesia Type: General Level of consciousness: awake and alert, oriented and patient cooperative Pain management: pain level controlled Vital Signs Assessment: post-procedure vital signs reviewed and stable Respiratory status: spontaneous breathing, nonlabored ventilation and respiratory function stable Cardiovascular status: blood pressure returned to baseline Postop Assessment: no signs of nausea or vomiting Anesthetic complications: no    Last Vitals:  Filed Vitals:   11/11/15 1455 11/11/15 1500  BP: 119/73   Resp: 24 15    Last Pain: There were no vitals filed for this visit.               [name] J

## 2015-11-11 NOTE — Anesthesia Preprocedure Evaluation (Signed)
Anesthesia Evaluation  Patient identified by MRN, date of birth, ID band Patient awake    Reviewed: Allergy & Precautions, NPO status , Patient's Chart, lab work & pertinent test results  Airway Mallampati: I  TM Distance: >3 FB     Dental  (+) Partial Lower, Teeth Intact   Pulmonary former smoker,    breath sounds clear to auscultation       Cardiovascular hypertension, Pt. on medications  Rhythm:Regular Rate:Normal     Neuro/Psych    GI/Hepatic negative GI ROS,   Endo/Other    Renal/GU      Musculoskeletal   Abdominal   Peds  Hematology   Anesthesia Other Findings   Reproductive/Obstetrics                             Anesthesia Physical Anesthesia Plan  ASA: II  Anesthesia Plan: General   Post-op Pain Management:    Induction: Intravenous  Airway Management Planned: LMA  Additional Equipment:   Intra-op Plan:   Post-operative Plan: Extubation in OR  Informed Consent: I have reviewed the patients History and Physical, chart, labs and discussed the procedure including the risks, benefits and alternatives for the proposed anesthesia with the patient or authorized representative who has indicated his/her understanding and acceptance.     Plan Discussed with:   Anesthesia Plan Comments:         Anesthesia Quick Evaluation

## 2015-11-11 NOTE — Progress Notes (Signed)
Awake. Talking. Needs to void. Wants to go to [facility].

## 2015-11-11 NOTE — Anesthesia Procedure Notes (Signed)
Procedure Name: LMA Insertion Date/Time: 11/11/2015 4:02 PM Performed by: Vista Deck Pre-anesthesia Checklist: Patient identified, Patient being monitored, Emergency Drugs available, Timeout performed and Suction available Patient Re-evaluated:Patient Re-evaluated prior to inductionOxygen Delivery Method: Circle System Utilized Preoxygenation: Pre-oxygenation with 100% oxygen Intubation Type: IV induction Ventilation: Mask ventilation without difficulty LMA: LMA inserted LMA Size: 4.0 Number of attempts: 1 Placement Confirmation: positive ETCO2 and breath sounds checked- equal and bilateral Tube secured with: Tape Dental Injury: Teeth and Oropharynx as per pre-operative assessment

## 2015-11-13 ENCOUNTER — Encounter (HOSPITAL_COMMUNITY): Payer: Self-pay | Admitting: Urology

## 2015-11-15 NOTE — Op Note (Signed)
.  Preoperative diagnosis: Left ureteral stone  Postoperative diagnosis: Same  Procedure: 1 cystoscopy 2. Left retrograde pyelography 3.  Intraoperative fluoroscopy, under one hour, with interpretation 4.  Left ureteroscopic stone manipulation with basket extraction 5.  Left 6 x 26 JJ stent placement  Attending: Rosie Fate  Anesthesia: General  Estimated blood loss: None  Drains: Left 6 x 26 JJ ureteral stent without tether  Specimens: left ureteral calculus  Antibiotics: ancef  Findings: left distal ureteral stone. Moderate hydronephrosis. No masses/lesions in the bladder. Ureteral orifices in normal anatomic location.  Indications: Patient is a 65 year old male/male with a history of left ureteral stone and who has persistent left flank pain.  After discussing treatment options, he decided proceed with left ureteroscopic stone manipulation.  Procedure her in detail: The patient was brought to the operating room and a brief timeout was done to ensure correct patient, correct procedure, correct site.  General anesthesia was administered patient was placed in dorsal lithotomy position.  Her genitalia was then prepped and draped in usual sterile fashion.  A rigid 30 French cystoscope was passed in the urethra and the bladder.  Bladder was inspected free masses or lesions.  the ureteral orifices were in the normal orthotopic locations.  a 6 french ureteral catheter was then instilled into the left ureteral orifice.  a gentle retrograde was obtained and findings noted above.  we then placed a zip wire through the ureteral catheter and advanced up to the renal pelvis.  we then removed the cystoscope and cannulated the left ureteral orifice with a semirigid ureteroscope.  A 67mm calculus was excountered in the distal ureter. Using an NGage basket the stone was removed and sent for analysis. we then placed a 6 x 26 double-j ureteral stent over the original zip wire. We then removed the wire  and good coil was noted in the the renal pelvis under fluoroscopy and the bladder under direct vision.     the bladder was then drained and this concluded the procedure which was well tolerated by patient.  Complications: None  Condition: Stable, extubated, transferred to PACU  Plan: Patient is to be discharged home as to follow-up in one week for stent removal.

## 2015-11-18 ENCOUNTER — Ambulatory Visit (INDEPENDENT_AMBULATORY_CARE_PROVIDER_SITE_OTHER): Payer: Medicare HMO | Admitting: Urology

## 2015-11-18 DIAGNOSIS — N201 Calculus of ureter: Secondary | ICD-10-CM

## 2015-11-20 ENCOUNTER — Other Ambulatory Visit: Payer: Self-pay | Admitting: Urology

## 2015-11-20 DIAGNOSIS — N201 Calculus of ureter: Secondary | ICD-10-CM

## 2015-11-20 LAB — STONE ANALYSIS
CA OXALATE, DIHYDRATE: 25 %
CA OXALATE, MONOHYDR.: 70 %
CA PHOS CRY STONE QL IR: 5 %
STONE WEIGHT KSTONE: 60 mg

## 2015-11-25 ENCOUNTER — Ambulatory Visit: Payer: Self-pay | Admitting: Urology

## 2015-12-14 ENCOUNTER — Ambulatory Visit (HOSPITAL_COMMUNITY)
Admission: RE | Admit: 2015-12-14 | Discharge: 2015-12-14 | Disposition: A | Discharge status: Home / Self Care | Payer: Medicare HMO | Source: Ambulatory Visit | Attending: Urology | Admitting: Urology

## 2015-12-14 DIAGNOSIS — N201 Calculus of ureter: Secondary | ICD-10-CM | POA: Insufficient documentation

## 2015-12-14 DIAGNOSIS — N400 Enlarged prostate without lower urinary tract symptoms: Secondary | ICD-10-CM | POA: Diagnosis not present

## 2015-12-23 ENCOUNTER — Ambulatory Visit (INDEPENDENT_AMBULATORY_CARE_PROVIDER_SITE_OTHER): Payer: Medicare HMO | Admitting: Urology

## 2015-12-23 DIAGNOSIS — N200 Calculus of kidney: Secondary | ICD-10-CM

## 2016-04-15 ENCOUNTER — Other Ambulatory Visit (HOSPITAL_COMMUNITY): Payer: Self-pay | Admitting: Internal Medicine

## 2016-04-15 ENCOUNTER — Other Ambulatory Visit: Payer: Self-pay | Admitting: Internal Medicine

## 2016-04-15 DIAGNOSIS — R918 Other nonspecific abnormal finding of lung field: Secondary | ICD-10-CM

## 2016-04-26 ENCOUNTER — Ambulatory Visit (HOSPITAL_COMMUNITY)
Admission: RE | Admit: 2016-04-26 | Discharge: 2016-04-26 | Disposition: A | Discharge status: Home / Self Care | Payer: Medicare HMO | Source: Ambulatory Visit | Attending: Internal Medicine | Admitting: Internal Medicine

## 2016-04-26 DIAGNOSIS — R918 Other nonspecific abnormal finding of lung field: Secondary | ICD-10-CM | POA: Diagnosis not present

## 2016-04-26 DIAGNOSIS — I2510 Atherosclerotic heart disease of native coronary artery without angina pectoris: Secondary | ICD-10-CM | POA: Diagnosis not present

## 2016-05-02 DIAGNOSIS — I10 Essential (primary) hypertension: Secondary | ICD-10-CM | POA: Diagnosis not present

## 2016-05-02 DIAGNOSIS — Z79899 Other long term (current) drug therapy: Secondary | ICD-10-CM | POA: Diagnosis not present

## 2016-05-02 DIAGNOSIS — E119 Type 2 diabetes mellitus without complications: Secondary | ICD-10-CM | POA: Diagnosis not present

## 2016-05-02 DIAGNOSIS — E785 Hyperlipidemia, unspecified: Secondary | ICD-10-CM | POA: Diagnosis not present

## 2016-05-16 ENCOUNTER — Other Ambulatory Visit: Payer: Self-pay | Admitting: Urology

## 2016-05-16 DIAGNOSIS — N200 Calculus of kidney: Secondary | ICD-10-CM

## 2016-05-17 DIAGNOSIS — R7301 Impaired fasting glucose: Secondary | ICD-10-CM | POA: Diagnosis not present

## 2016-05-17 DIAGNOSIS — Z23 Encounter for immunization: Secondary | ICD-10-CM | POA: Diagnosis not present

## 2016-05-17 DIAGNOSIS — E785 Hyperlipidemia, unspecified: Secondary | ICD-10-CM | POA: Diagnosis not present

## 2016-05-17 DIAGNOSIS — I10 Essential (primary) hypertension: Secondary | ICD-10-CM | POA: Diagnosis not present

## 2016-07-06 ENCOUNTER — Ambulatory Visit (HOSPITAL_COMMUNITY)
Admission: RE | Admit: 2016-07-06 | Discharge: 2016-07-06 | Disposition: A | Discharge status: Home / Self Care | Payer: Medicare HMO | Source: Ambulatory Visit | Attending: Urology | Admitting: Urology

## 2016-07-06 DIAGNOSIS — N200 Calculus of kidney: Secondary | ICD-10-CM | POA: Insufficient documentation

## 2016-08-03 ENCOUNTER — Ambulatory Visit (INDEPENDENT_AMBULATORY_CARE_PROVIDER_SITE_OTHER): Payer: Medicare HMO | Admitting: Urology

## 2016-08-03 DIAGNOSIS — N200 Calculus of kidney: Secondary | ICD-10-CM

## 2016-11-11 DIAGNOSIS — Z79899 Other long term (current) drug therapy: Secondary | ICD-10-CM | POA: Diagnosis not present

## 2016-11-11 DIAGNOSIS — E119 Type 2 diabetes mellitus without complications: Secondary | ICD-10-CM | POA: Diagnosis not present

## 2016-11-18 DIAGNOSIS — I10 Essential (primary) hypertension: Secondary | ICD-10-CM | POA: Diagnosis not present

## 2016-11-18 DIAGNOSIS — R7301 Impaired fasting glucose: Secondary | ICD-10-CM | POA: Diagnosis not present

## 2017-05-25 DIAGNOSIS — R7303 Prediabetes: Secondary | ICD-10-CM | POA: Diagnosis not present

## 2017-05-25 DIAGNOSIS — Z79899 Other long term (current) drug therapy: Secondary | ICD-10-CM | POA: Diagnosis not present

## 2017-05-25 DIAGNOSIS — Z125 Encounter for screening for malignant neoplasm of prostate: Secondary | ICD-10-CM | POA: Diagnosis not present

## 2017-05-25 DIAGNOSIS — I10 Essential (primary) hypertension: Secondary | ICD-10-CM | POA: Diagnosis not present

## 2017-05-31 ENCOUNTER — Other Ambulatory Visit (HOSPITAL_COMMUNITY): Payer: Self-pay | Admitting: Internal Medicine

## 2017-05-31 DIAGNOSIS — R918 Other nonspecific abnormal finding of lung field: Secondary | ICD-10-CM

## 2017-06-01 DIAGNOSIS — E785 Hyperlipidemia, unspecified: Secondary | ICD-10-CM | POA: Diagnosis not present

## 2017-06-01 DIAGNOSIS — R7303 Prediabetes: Secondary | ICD-10-CM | POA: Diagnosis not present

## 2017-06-01 DIAGNOSIS — Z6827 Body mass index (BMI) 27.0-27.9, adult: Secondary | ICD-10-CM | POA: Diagnosis not present

## 2017-06-01 DIAGNOSIS — I10 Essential (primary) hypertension: Secondary | ICD-10-CM | POA: Diagnosis not present

## 2017-06-01 DIAGNOSIS — Z23 Encounter for immunization: Secondary | ICD-10-CM | POA: Diagnosis not present

## 2017-06-12 ENCOUNTER — Ambulatory Visit (HOSPITAL_COMMUNITY)
Admission: RE | Admit: 2017-06-12 | Discharge: 2017-06-12 | Disposition: A | Discharge status: Home / Self Care | Payer: Medicare HMO | Source: Ambulatory Visit | Attending: Internal Medicine | Admitting: Internal Medicine

## 2017-06-12 DIAGNOSIS — I2510 Atherosclerotic heart disease of native coronary artery without angina pectoris: Secondary | ICD-10-CM | POA: Insufficient documentation

## 2017-06-12 DIAGNOSIS — I700 Atherosclerosis of aorta: Secondary | ICD-10-CM | POA: Insufficient documentation

## 2017-06-12 DIAGNOSIS — R918 Other nonspecific abnormal finding of lung field: Secondary | ICD-10-CM

## 2017-11-09 DIAGNOSIS — I10 Essential (primary) hypertension: Secondary | ICD-10-CM | POA: Diagnosis not present

## 2017-11-09 DIAGNOSIS — E785 Hyperlipidemia, unspecified: Secondary | ICD-10-CM | POA: Diagnosis not present

## 2017-11-09 DIAGNOSIS — R7301 Impaired fasting glucose: Secondary | ICD-10-CM | POA: Diagnosis not present

## 2017-11-16 DIAGNOSIS — Z6827 Body mass index (BMI) 27.0-27.9, adult: Secondary | ICD-10-CM | POA: Diagnosis not present

## 2017-11-16 DIAGNOSIS — R7303 Prediabetes: Secondary | ICD-10-CM | POA: Diagnosis not present

## 2017-11-16 DIAGNOSIS — I700 Atherosclerosis of aorta: Secondary | ICD-10-CM | POA: Diagnosis not present

## 2017-11-16 DIAGNOSIS — I10 Essential (primary) hypertension: Secondary | ICD-10-CM | POA: Diagnosis not present

## 2017-12-24 IMAGING — RF DG RETROGRADE PYELOGRAM
1 series · 3 of 3 positions shown · non-contrast
Comparison: CT abdomen pelvis - 09/18/2015 ; abdominal radiograph -
10/28/2015

CLINICAL DATA: Left ureteral calculus. Retrograde pyelogram and
stent placement.

EXAM:
RETROGRADE PYELOGRAM

[Series 1: run · 3 of 3 slices shown]
[im 1/3]
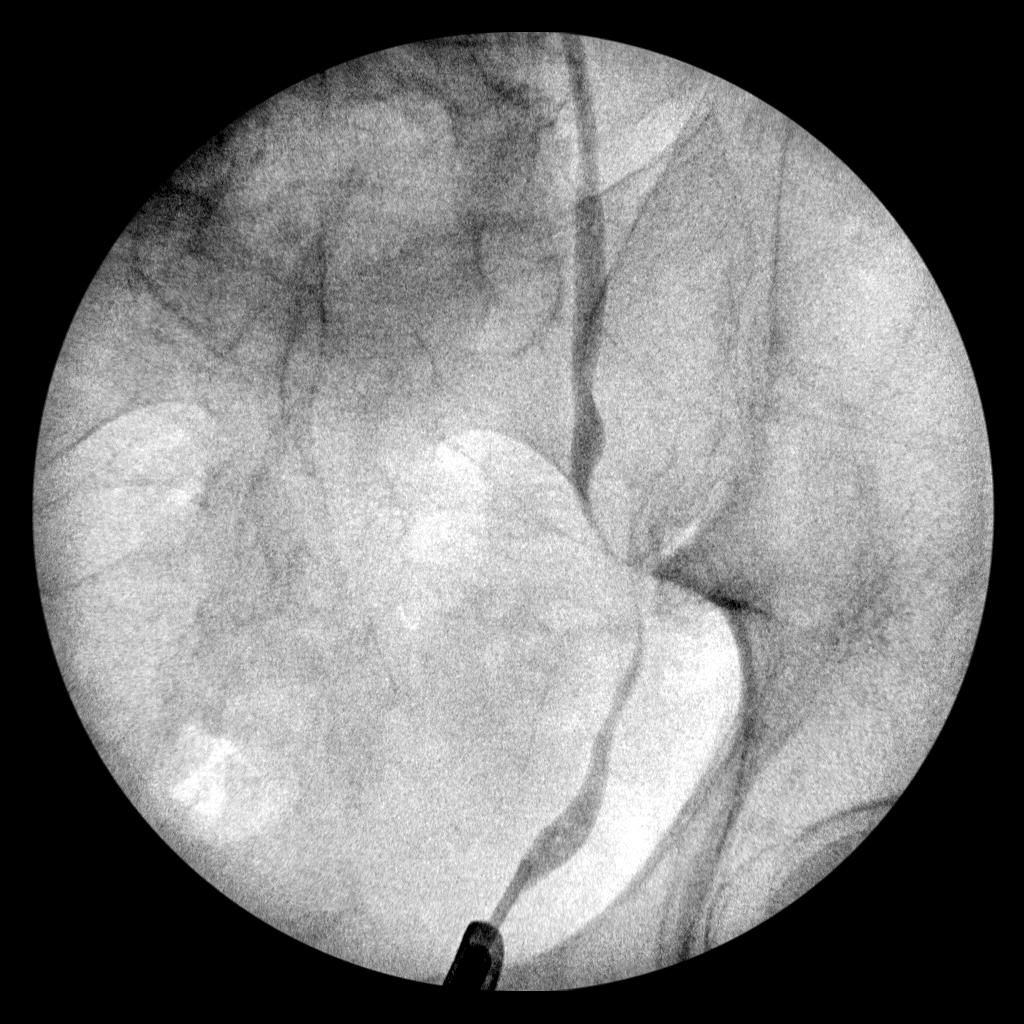
[im 2/3]
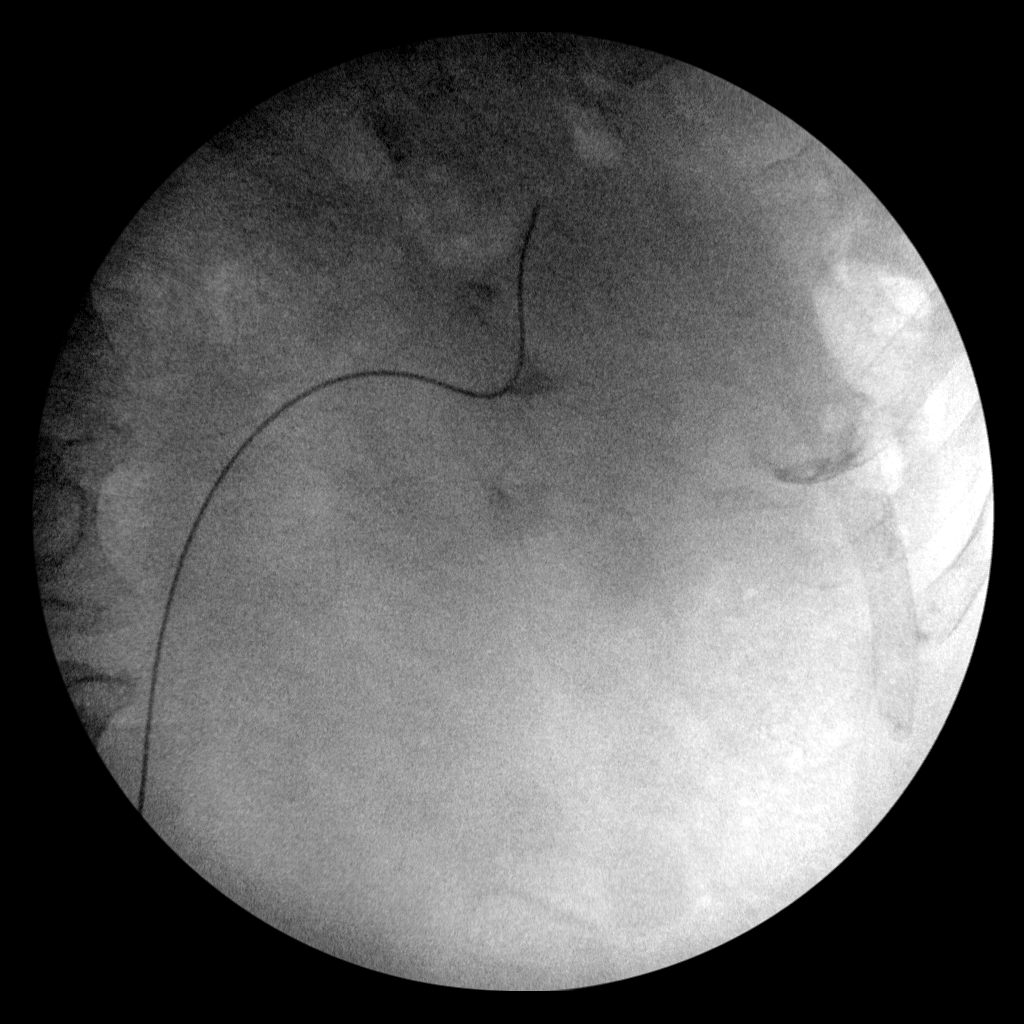
[im 3/3]
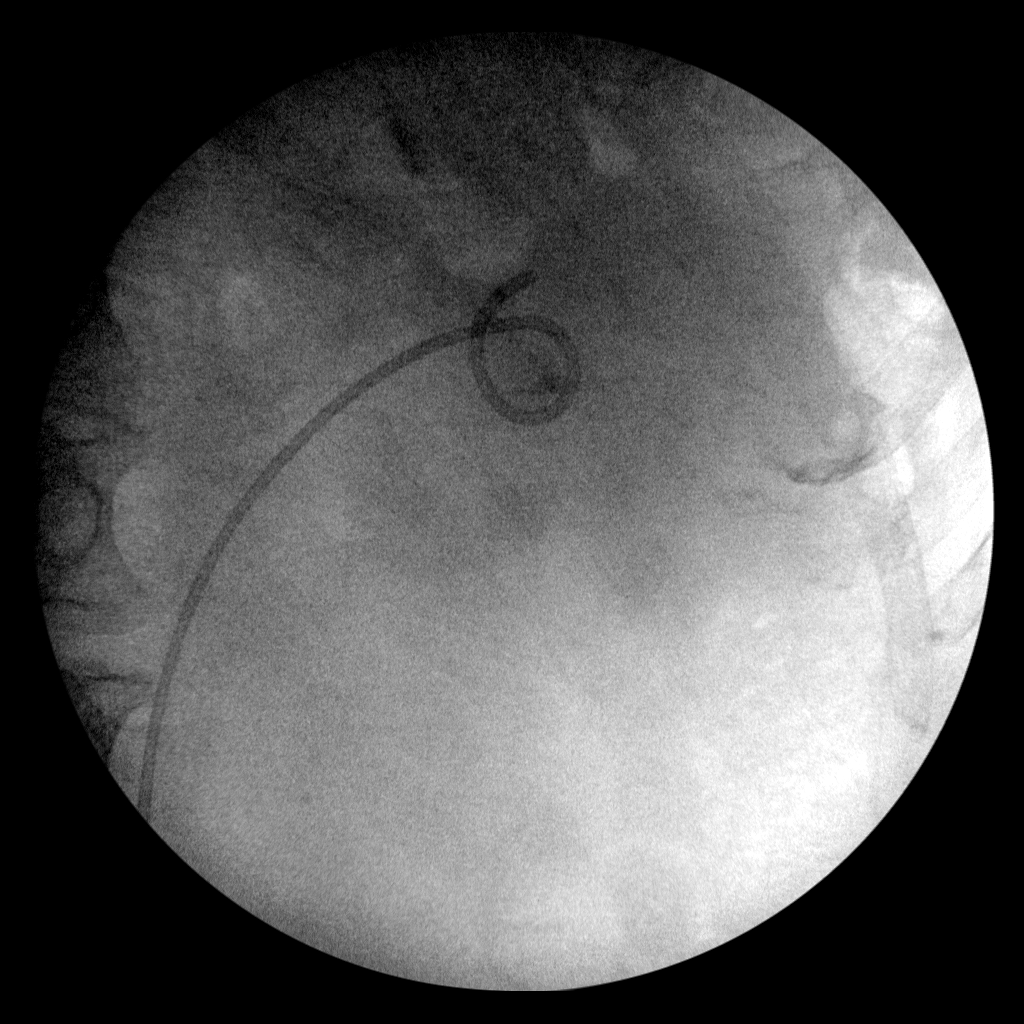

[3 of 3 positions shown; findings below may reference images not displayed]

FINDINGS: Three spot fluoroscopic images during left-sided retrograde
pyelogram and stent placement are provided for review.

Initial image demonstrates selective cannulation opacification the
distal aspect of the left ureter. There are several ill-defined
nonocclusive filling defect in the distal aspect of the left ureter
which may represent previous identified distal left ureteral stone
versus air bubbles.

Subsequent images demonstrate placement of a left-sided presumably
double-J ureteral stent with superior portion overlying the expected
location of the left renal pelvis.
IMPRESSION: Left-sided intraoperative retrograde pyelogram and ureteral stent
placement as above.

## 2018-06-09 IMAGING — CT CT CHEST W/O CM
2 of 3 series · 14 of 36 positions shown, 17 images · non-contrast
Comparison: Chest CT September 30, 2015

CLINICAL DATA: Pulmonary nodules

EXAM:
CT CHEST WITHOUT CONTRAST
TECHNIQUE: Multidetector CT imaging of the chest was performed following the
standard protocol without IV contrast.

[Series 2: thorax · axial · 0.69mm/px · z∈[+1360,+1632]mm · 11 of 160 slices shown, 14 images]
[im 12/160  mediastinal]
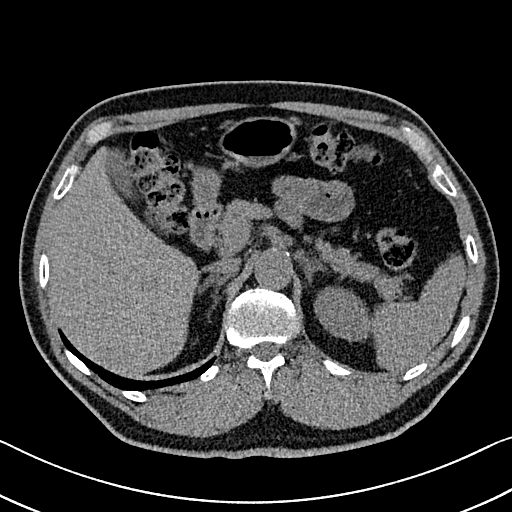
[im 12/160  lung]
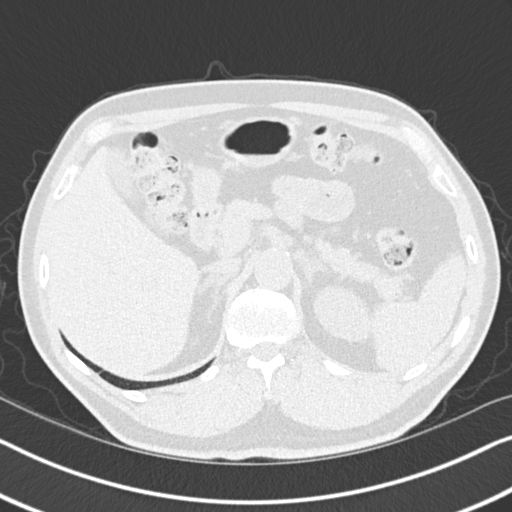
[im 24/160  lung]
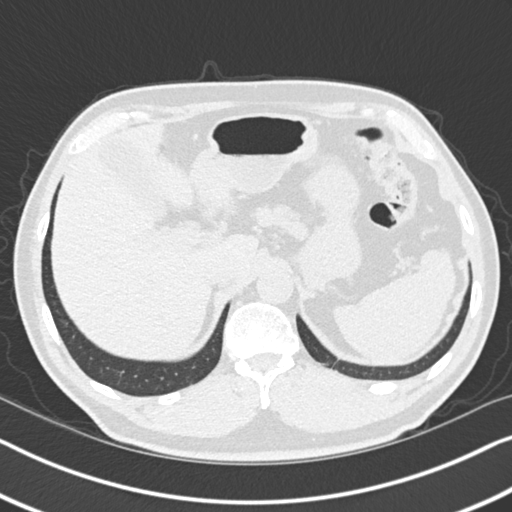
[im 36/160  lung]
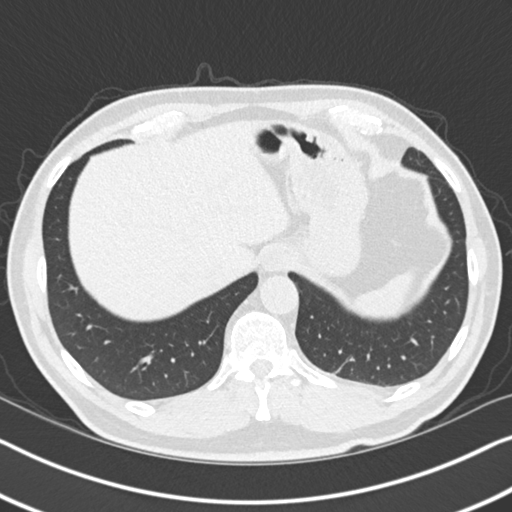
[im 54/160  lung]
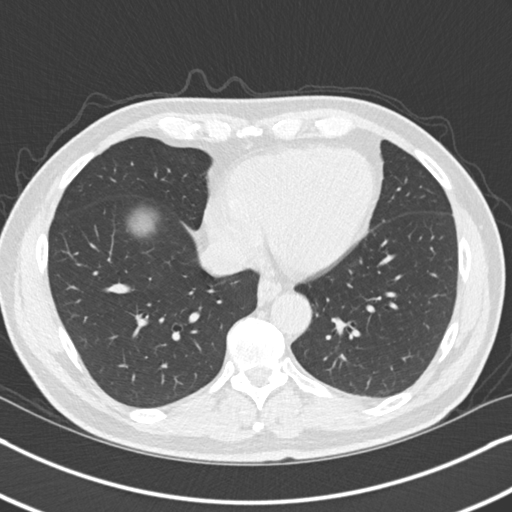
[im 65/160  mediastinal]
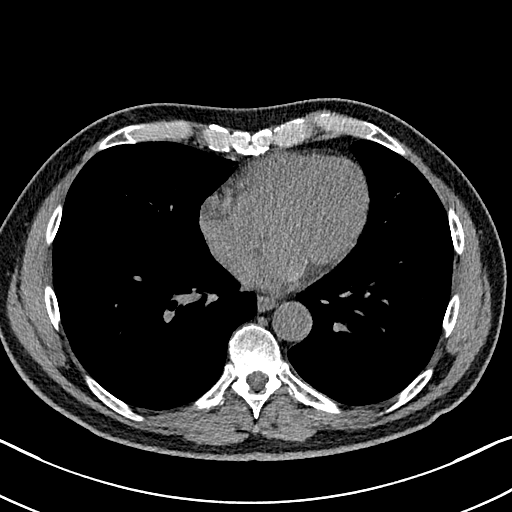
[im 65/160  lung]
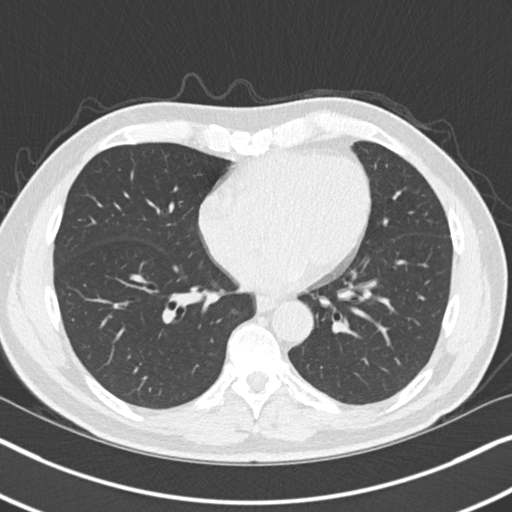
[im 83/160  lung]
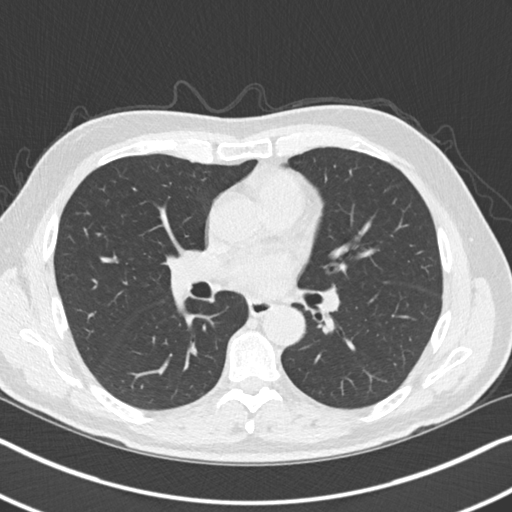
[im 95/160  lung]
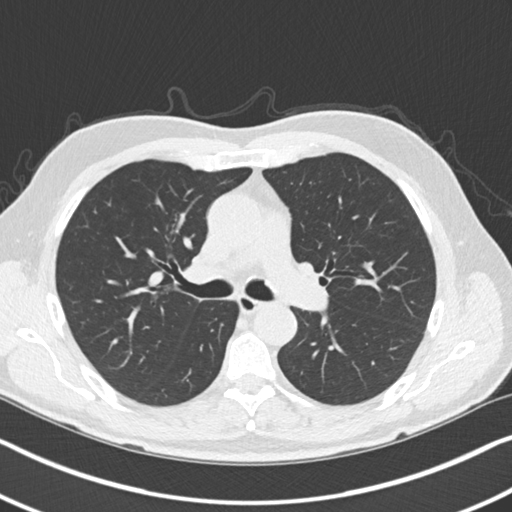
[im 107/160  lung]
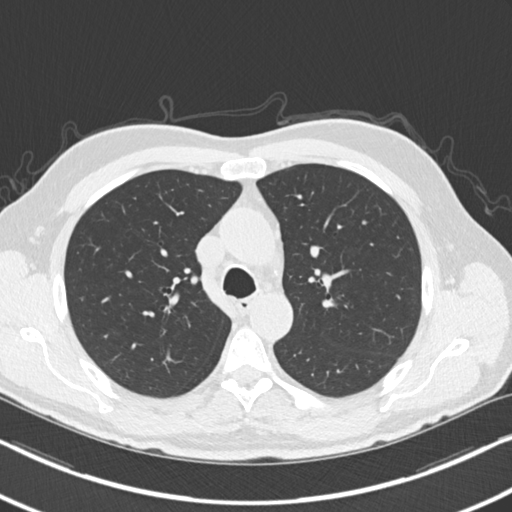
[im 124/160  mediastinal]
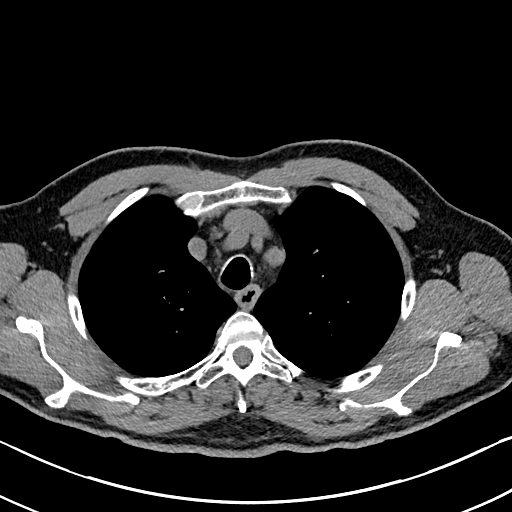
[im 124/160  lung]
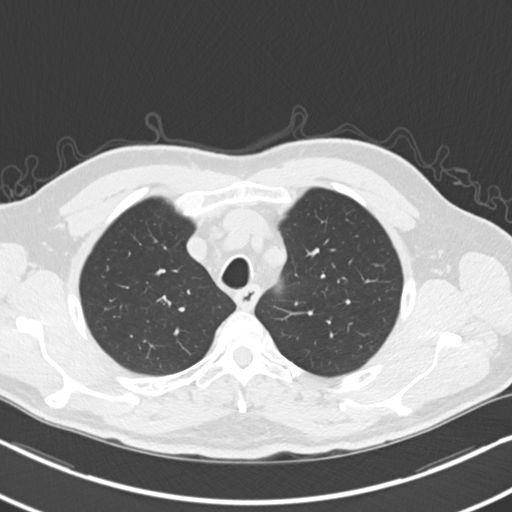
[im 136/160  lung]
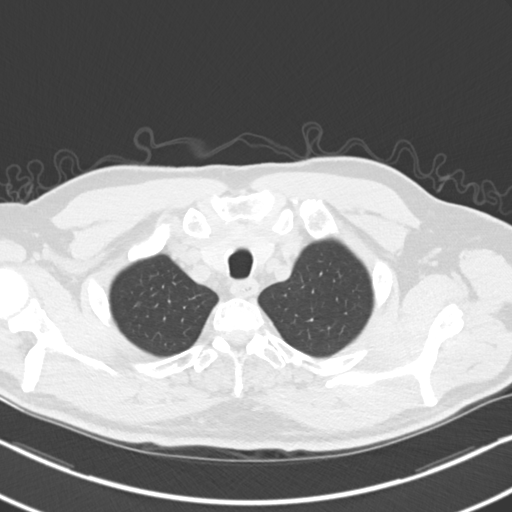
[im 148/160  lung]
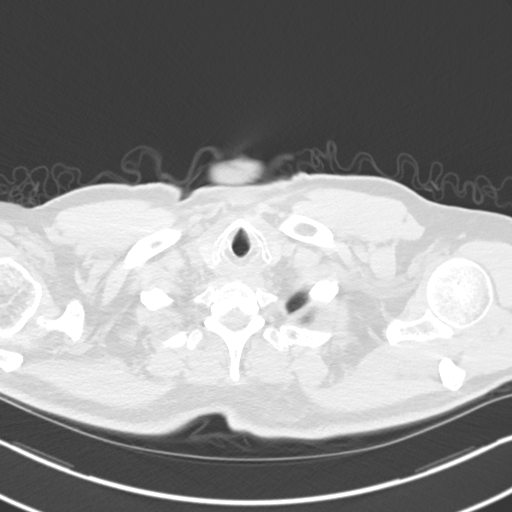

[Series 5: coronal · coronal · 0.65mm/px · 3 of 151 slices shown]
[im 31/151  lung]
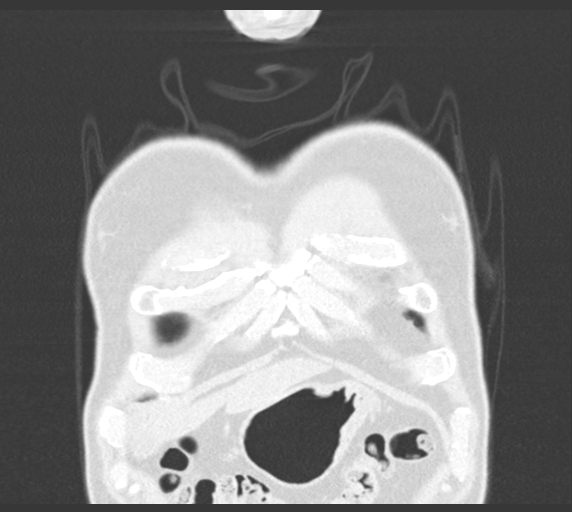
[im 61/151  lung]
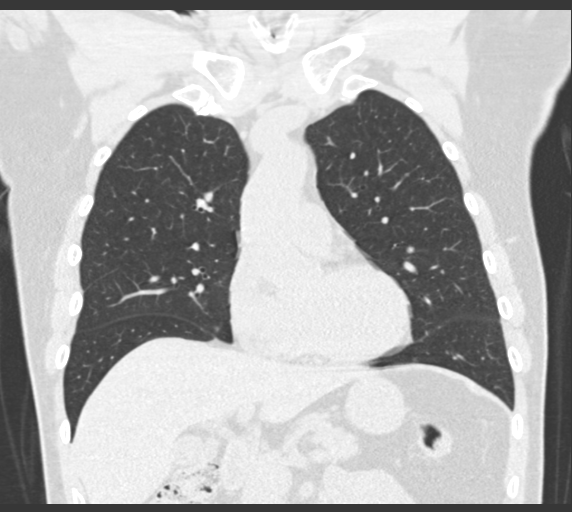
[im 91/151  lung]
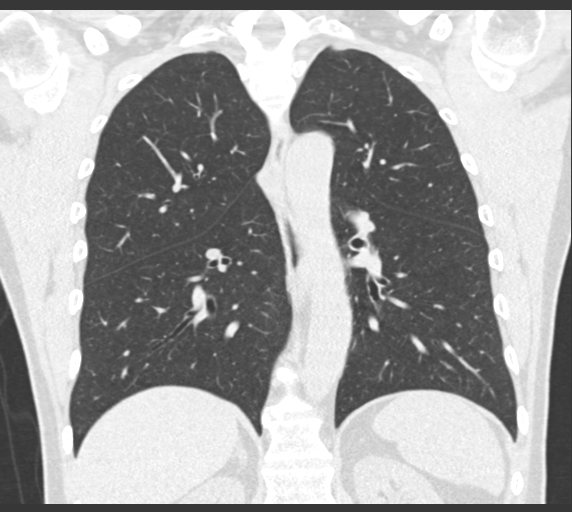

[14 of 36 positions shown; findings below may reference images not displayed]

FINDINGS: Cardiovascular: There is no demonstrable thoracic aortic aneurysm.
The visualized great vessels are normal except for slight
calcification at the origin of the left subclavian artery. There is
modest calcification in the thoracic aorta. There are small
scattered foci of coronary artery calcification. The pericardium is
not appreciably thickened.

Mediastinum/Nodes: Thyroid appears unremarkable. There are scattered
subcentimeter mediastinal lymph nodes. By size criteria, there is no
thoracic adenopathy.

Lungs/Pleura: On axial slice 40 series 3, there is a stable 2 mm
nodular opacity in the posterior segment of the right upper lobe. On
axial slice 74 series 3, there is a stable 3 mm nodular opacity in
the anterior segment of the left upper lobe. On axial slice 83
series 3, there is a stable 6 mm nodular opacity in the anterior
segment of the right upper lobe. There is a nearby 4 mm nodular
opacity in the same area on this same slice. On axial slice 87
series 3, there is a stable 2 mm nodular opacity in the anterior
segment right upper lobe inferiorly. On axial slice 92 series 3,
there is a stable 5 mm nodular opacity in the medial segment of the
right middle lobe. On axial slice 88 series 3, there is an apparent
new nodular opacity in the medial segment right middle lobe. On
axial slice 104 series 3, there is a 5 mm nodular opacity in the
inferior lingula, not felt to be changed. On axial slice 104 series
3, there is a 4 mm nodular opacity in the medial segment of the
right middle lobe which may be new. On axial slice 122 series 3,
there is a 6 mm nodular opacity in the anterior segment of the left
lower lobe, unchanged. On axial slice 123 series 3, there is a 2 mm
nodular opacity in the medial segment of the right lower lobe, new.

There is slight scarring in the posterior left base, stable. There
is no edema or consolidation. There is no pleural effusion or
pleural thickening.

Upper Abdomen: In the visualized upper abdomen, there is mild
atherosclerotic calcification in the aorta. Visualized upper
abdominal structures otherwise appear unremarkable.

Musculoskeletal: There are no blastic or lytic bone lesions. No
chest wall lesions are evident.
IMPRESSION: There are again noted multiple small pulmonary nodular opacities,
largest measuring 6 mm. There are occasional new nodular lesions,
none larger than 4 mm in size. No follow-up needed if patient is
low-risk (and has no known or suspected primary neoplasm).
Non-contrast chest CT can be considered in 12 months if patient is
high-risk. This recommendation follows the consensus statement:
Guidelines for Management of Incidental Pulmonary Nodules Detected
[DATE].

No lung edema or consolidation. No adenopathy. There are scattered
foci of atherosclerotic calcification. Scattered foci of coronary
artery calcification noted.

## 2018-12-11 ENCOUNTER — Other Ambulatory Visit (HOSPITAL_COMMUNITY): Payer: Self-pay | Admitting: Internal Medicine

## 2018-12-11 ENCOUNTER — Other Ambulatory Visit: Payer: Self-pay | Admitting: Internal Medicine

## 2018-12-11 DIAGNOSIS — R911 Solitary pulmonary nodule: Secondary | ICD-10-CM

## 2018-12-20 ENCOUNTER — Ambulatory Visit (HOSPITAL_COMMUNITY)
Admission: RE | Admit: 2018-12-20 | Discharge: 2018-12-20 | Disposition: A | Discharge status: Home / Self Care | Payer: Medicare HMO | Source: Ambulatory Visit | Attending: Internal Medicine | Admitting: Internal Medicine

## 2018-12-20 ENCOUNTER — Other Ambulatory Visit: Payer: Self-pay

## 2018-12-20 DIAGNOSIS — R911 Solitary pulmonary nodule: Secondary | ICD-10-CM | POA: Diagnosis present

## 2020-01-03 DIAGNOSIS — I10 Essential (primary) hypertension: Secondary | ICD-10-CM | POA: Diagnosis not present

## 2020-04-24 DIAGNOSIS — Z125 Encounter for screening for malignant neoplasm of prostate: Secondary | ICD-10-CM | POA: Diagnosis not present

## 2020-04-24 DIAGNOSIS — R7303 Prediabetes: Secondary | ICD-10-CM | POA: Diagnosis not present

## 2020-04-24 DIAGNOSIS — Z79899 Other long term (current) drug therapy: Secondary | ICD-10-CM | POA: Diagnosis not present

## 2020-04-24 DIAGNOSIS — E785 Hyperlipidemia, unspecified: Secondary | ICD-10-CM | POA: Diagnosis not present

## 2020-04-24 DIAGNOSIS — I10 Essential (primary) hypertension: Secondary | ICD-10-CM | POA: Diagnosis not present

## 2020-05-01 DIAGNOSIS — R7301 Impaired fasting glucose: Secondary | ICD-10-CM | POA: Diagnosis not present

## 2020-05-01 DIAGNOSIS — E785 Hyperlipidemia, unspecified: Secondary | ICD-10-CM | POA: Diagnosis not present

## 2020-05-01 DIAGNOSIS — Z0001 Encounter for general adult medical examination with abnormal findings: Secondary | ICD-10-CM | POA: Diagnosis not present

## 2020-05-01 DIAGNOSIS — I4510 Unspecified right bundle-branch block: Secondary | ICD-10-CM | POA: Diagnosis not present

## 2020-05-01 DIAGNOSIS — I10 Essential (primary) hypertension: Secondary | ICD-10-CM | POA: Diagnosis not present

## 2020-05-01 DIAGNOSIS — I700 Atherosclerosis of aorta: Secondary | ICD-10-CM | POA: Diagnosis not present

## 2020-10-27 DIAGNOSIS — R7303 Prediabetes: Secondary | ICD-10-CM | POA: Diagnosis not present

## 2020-11-02 DIAGNOSIS — I10 Essential (primary) hypertension: Secondary | ICD-10-CM | POA: Diagnosis not present

## 2020-11-02 DIAGNOSIS — I700 Atherosclerosis of aorta: Secondary | ICD-10-CM | POA: Diagnosis not present

## 2020-11-02 DIAGNOSIS — R7309 Other abnormal glucose: Secondary | ICD-10-CM | POA: Diagnosis not present

## 2020-11-19 ENCOUNTER — Other Ambulatory Visit: Payer: Self-pay | Admitting: Internal Medicine

## 2020-11-19 ENCOUNTER — Other Ambulatory Visit (HOSPITAL_COMMUNITY): Payer: Self-pay | Admitting: Internal Medicine

## 2020-11-27 ENCOUNTER — Other Ambulatory Visit: Payer: Self-pay | Admitting: Internal Medicine

## 2020-11-27 DIAGNOSIS — I77819 Aortic ectasia, unspecified site: Secondary | ICD-10-CM

## 2020-12-08 ENCOUNTER — Ambulatory Visit (HOSPITAL_COMMUNITY)
Admission: RE | Admit: 2020-12-08 | Discharge: 2020-12-08 | Disposition: A | Discharge status: Home / Self Care | Payer: Medicare HMO | Source: Ambulatory Visit | Attending: Internal Medicine | Admitting: Internal Medicine

## 2020-12-08 ENCOUNTER — Other Ambulatory Visit: Payer: Self-pay

## 2020-12-08 DIAGNOSIS — I77819 Aortic ectasia, unspecified site: Secondary | ICD-10-CM | POA: Diagnosis not present

## 2020-12-08 DIAGNOSIS — I77811 Abdominal aortic ectasia: Secondary | ICD-10-CM | POA: Insufficient documentation

## 2020-12-08 DIAGNOSIS — I700 Atherosclerosis of aorta: Secondary | ICD-10-CM | POA: Insufficient documentation

## 2021-03-25 ENCOUNTER — Encounter: Payer: Self-pay | Admitting: *Deleted

## 2021-04-27 DIAGNOSIS — R7303 Prediabetes: Secondary | ICD-10-CM | POA: Diagnosis not present

## 2021-04-27 DIAGNOSIS — E785 Hyperlipidemia, unspecified: Secondary | ICD-10-CM | POA: Diagnosis not present

## 2021-04-27 DIAGNOSIS — Z125 Encounter for screening for malignant neoplasm of prostate: Secondary | ICD-10-CM | POA: Diagnosis not present

## 2021-04-27 DIAGNOSIS — Z79899 Other long term (current) drug therapy: Secondary | ICD-10-CM | POA: Diagnosis not present

## 2021-04-27 DIAGNOSIS — I700 Atherosclerosis of aorta: Secondary | ICD-10-CM | POA: Diagnosis not present

## 2021-04-27 DIAGNOSIS — I10 Essential (primary) hypertension: Secondary | ICD-10-CM | POA: Diagnosis not present

## 2021-05-03 ENCOUNTER — Encounter: Payer: Self-pay | Admitting: *Deleted

## 2021-05-04 DIAGNOSIS — E785 Hyperlipidemia, unspecified: Secondary | ICD-10-CM | POA: Diagnosis not present

## 2021-05-04 DIAGNOSIS — R7309 Other abnormal glucose: Secondary | ICD-10-CM | POA: Diagnosis not present

## 2021-05-04 DIAGNOSIS — Z6827 Body mass index (BMI) 27.0-27.9, adult: Secondary | ICD-10-CM | POA: Diagnosis not present

## 2021-05-04 DIAGNOSIS — I4510 Unspecified right bundle-branch block: Secondary | ICD-10-CM | POA: Diagnosis not present

## 2021-05-04 DIAGNOSIS — N1831 Chronic kidney disease, stage 3a: Secondary | ICD-10-CM | POA: Diagnosis not present

## 2021-05-04 DIAGNOSIS — I10 Essential (primary) hypertension: Secondary | ICD-10-CM | POA: Diagnosis not present

## 2021-05-04 DIAGNOSIS — I700 Atherosclerosis of aorta: Secondary | ICD-10-CM | POA: Diagnosis not present

## 2021-06-15 ENCOUNTER — Ambulatory Visit (INDEPENDENT_AMBULATORY_CARE_PROVIDER_SITE_OTHER): Payer: Self-pay | Admitting: *Deleted

## 2021-06-15 ENCOUNTER — Other Ambulatory Visit: Payer: Self-pay

## 2021-06-15 VITALS — Ht 67.0 in | Wt 165.0 lb

## 2021-06-15 DIAGNOSIS — Z1211 Encounter for screening for malignant neoplasm of colon: Secondary | ICD-10-CM

## 2021-06-15 NOTE — Progress Notes (Addendum)
Gastroenterology Pre-Procedure Review  Request Date: 06/15/2021 Requesting Physician: 10 year recall, Last TCS done 04/20/2011 by Dr. Gala Romney, hyperplastic polyps  PATIENT REVIEW QUESTIONS: The patient responded to the following health history questions as indicated:    1. Diabetes Melitis: no 2. Joint replacements in the past 12 months: no 3. Major health problems in the past 3 months: no 4. Has an artificial valve or MVP: no 5. Has a defibrillator: no 6. Has been advised in past to take antibiotics in advance of a procedure like teeth cleaning: no 7. Family history of colon cancer: no  8. Alcohol Use: no 9. Illicit drug Use: no 10. History of sleep apnea: no  11. History of coronary artery or other vascular stents placed within the last 12 months: no 12. History of any prior anesthesia complications: no 13. Body mass index is 25.84 kg/m.    MEDICATIONS & ALLERGIES:    Patient reports the following regarding taking any blood thinners:   Plavix? no Aspirin? Yes, 81 mg daily Coumadin? no Brilinta? no Xarelto? no Eliquis? no Pradaxa? no Savaysa? no Effient? no  Patient confirms/reports the following medications:  Current Outpatient Medications  Medication Sig Dispense Refill   aspirin EC 81 MG tablet Take 81 mg by mouth daily.       losartan (COZAAR) 100 MG tablet Take 100 mg by mouth daily.       simvastatin (ZOCOR) 10 MG tablet Take 10 mg by mouth at bedtime.       amLODipine (NORVASC) 5 MG tablet Take 5 mg by mouth daily.     No current facility-administered medications for this visit.    Patient confirms/reports the following allergies:  No Known Allergies  No orders of the defined types were placed in this encounter.   AUTHORIZATION INFORMATION Primary Insurance: [employer],  ID #: [id number],  Group #: [id number] Pre-Cert / Josem Kaufmann required: Yes, approved online 11/24/3660-9/47/6546 Pre-Cert / Josem Kaufmann #: [id number]  SCHEDULE INFORMATION: Procedure has been  scheduled as follows:  Date: 06/28/2021, Time: 10:00 Location: [facility] with Dr. Gala Romney  This Gastroenterology Pre-Precedure Review Form is being routed to the following provider(s): Roseanne Kaufman, NP

## 2021-06-15 NOTE — Progress Notes (Signed)
ASA 2. Appropriate.  ?

## 2021-06-18 NOTE — Progress Notes (Signed)
Pt made aware that I will call him once Mar/Apr schedules are available.

## 2021-06-22 ENCOUNTER — Encounter: Payer: Self-pay | Admitting: *Deleted

## 2021-06-22 MED ORDER — NA SULFATE-K SULFATE-MG SULF 17.5-3.13-1.6 GM/177ML PO SOLN: 1.0000 | Freq: Once | ORAL | 0 refills | Status: AC

## 2021-06-22 NOTE — Progress Notes (Signed)
Spoke to pt. Scheduled procedure for 06/28/2021 with arrival at 8:30.  Reviewed prep instructions with pt.  Pt aware I sent RX to his pharmacy.  Pt will come by tomorrow to pick up prep instructions.

## 2021-06-22 NOTE — Addendum Note (Signed)
Addended by: Metro Kung on: 06/22/2021 03:47 PM   Modules accepted: Orders

## 2021-06-24 ENCOUNTER — Other Ambulatory Visit: Payer: Self-pay | Admitting: *Deleted

## 2021-06-28 ENCOUNTER — Encounter (HOSPITAL_COMMUNITY)
Admission: RE | Disposition: A | Discharge status: Home / Self Care | Payer: Self-pay | Source: Home / Self Care | Attending: Internal Medicine

## 2021-06-28 ENCOUNTER — Ambulatory Visit (HOSPITAL_COMMUNITY)
Admission: RE | Admit: 2021-06-28 | Discharge: 2021-06-28 | Disposition: A | Discharge status: Home / Self Care | Payer: Medicare HMO | Attending: Internal Medicine | Admitting: Internal Medicine

## 2021-06-28 ENCOUNTER — Other Ambulatory Visit: Payer: Self-pay

## 2021-06-28 ENCOUNTER — Ambulatory Visit (HOSPITAL_COMMUNITY): Payer: Medicare HMO | Admitting: Anesthesiology

## 2021-06-28 ENCOUNTER — Encounter (HOSPITAL_COMMUNITY): Payer: Self-pay | Admitting: Internal Medicine

## 2021-06-28 DIAGNOSIS — Z87891 Personal history of nicotine dependence: Secondary | ICD-10-CM | POA: Insufficient documentation

## 2021-06-28 DIAGNOSIS — K640 First degree hemorrhoids: Secondary | ICD-10-CM | POA: Diagnosis not present

## 2021-06-28 DIAGNOSIS — I10 Essential (primary) hypertension: Secondary | ICD-10-CM | POA: Diagnosis not present

## 2021-06-28 DIAGNOSIS — Z1211 Encounter for screening for malignant neoplasm of colon: Secondary | ICD-10-CM | POA: Diagnosis not present

## 2021-06-28 HISTORY — PX: COLONOSCOPY WITH PROPOFOL: SHX5780

## 2021-06-28 SURGERY — COLONOSCOPY WITH PROPOFOL
Anesthesia: General

## 2021-06-28 MED ORDER — PROPOFOL 500 MG/50ML IV EMUL
INTRAVENOUS | Status: DC | PRN
Start: 2021-06-28 — End: 2021-06-28
  Administered 2021-06-28: 125 ug/kg/min via INTRAVENOUS

## 2021-06-28 MED ORDER — LIDOCAINE HCL (CARDIAC) PF 100 MG/5ML IV SOSY
PREFILLED_SYRINGE | INTRAVENOUS | Status: DC | PRN
  Administered 2021-06-28: 50 mg via INTRAVENOUS

## 2021-06-28 MED ORDER — LACTATED RINGERS IV SOLN: INTRAVENOUS | Status: DC

## 2021-06-28 MED ORDER — PROPOFOL 10 MG/ML IV BOLUS
INTRAVENOUS | Status: DC | PRN
  Administered 2021-06-28: 100 mg via INTRAVENOUS

## 2021-06-28 NOTE — Discharge Instructions (Addendum)
Colonoscopy Discharge Instructions  Read the instructions outlined below and refer to this sheet in the next few weeks. These discharge instructions provide you with general information on caring for yourself after you leave the hospital. Your doctor may also give you specific instructions. While your treatment has been planned according to the most current medical practices available, unavoidable complications occasionally occur. If you have any problems or questions after discharge, call Dr. Gala Romney at [phone]. ACTIVITY You may resume your regular activity, but move at a slower pace for the next 24 hours.  Take frequent rest periods for the next 24 hours.  Walking will help get rid of the air and reduce the bloated feeling in your belly (abdomen).  No driving for 24 hours (because of the medicine (anesthesia) used during the test).   Do not sign any important legal documents or operate any machinery for 24 hours (because of the anesthesia used during the test).  NUTRITION Drink plenty of fluids.  You may resume your normal diet as instructed by your doctor.  Begin with a light meal and progress to your normal diet. Heavy or fried foods are harder to digest and may make you feel sick to your stomach (nauseated).  Avoid alcoholic beverages for 24 hours or as instructed.  MEDICATIONS You may resume your normal medications unless your doctor tells you otherwise.  WHAT YOU CAN EXPECT TODAY Some feelings of bloating in the abdomen.  Passage of more gas than usual.  Spotting of blood in your stool or on the toilet paper.  IF YOU HAD POLYPS REMOVED DURING THE COLONOSCOPY: No aspirin products for 7 days or as instructed.  No alcohol for 7 days or as instructed.  Eat a soft diet for the next 24 hours.  FINDING OUT THE RESULTS OF YOUR TEST Not all test results are available during your visit. If your test results are not back during the visit, make an appointment with your caregiver to find out the  results. Do not assume everything is normal if you have not heard from your caregiver or the medical facility. It is important for you to follow up on all of your test results.  SEEK IMMEDIATE MEDICAL ATTENTION IF: You have more than a spotting of blood in your stool.  Your belly is swollen (abdominal distention).  You are nauseated or vomiting.  You have a temperature over 101.  You have abdominal pain or discomfort that is severe or gets worse throughout the day.     No polyps found today  Diverticulosis information provided  A future colonoscopy is not recommended as new symptoms develop  At patient request, called Hoyle Sauer at [phone] -reviewed findings and recommendations.  PATIENT INSTRUCTIONS POST-ANESTHESIA  IMMEDIATELY FOLLOWING SURGERY:  Do not drive or operate machinery for the first twenty four hours after surgery.  Do not make any important decisions for twenty four hours after surgery or while taking narcotic pain medications or sedatives.  If you develop intractable nausea and vomiting or a severe headache please notify your doctor immediately.  FOLLOW-UP:  Please make an appointment with your surgeon as instructed. You do not need to follow up with anesthesia unless specifically instructed to do so.  WOUND CARE INSTRUCTIONS (if applicable):  Keep a dry clean dressing on the anesthesia/puncture wound site if there is drainage.  Once the wound has quit draining you may leave it open to air.  Generally you should leave the bandage intact for twenty four hours unless there is drainage.  If the epidural site drains for more than 36-48 hours please call the anesthesia department.  QUESTIONS?:  Please feel free to call your physician or the hospital operator if you have any questions, and they will be happy to assist you.

## 2021-06-28 NOTE — Op Note (Signed)
[facility] Patient Name: Jeremy Sweeney Procedure Date: 06/28/2021 9:51 AM MRN: [id number] Date of Birth: 1950-12-12 Attending MD: Norvel Richards , MD CSN: [id number] Age: 71 Admit Type: Outpatient Procedure:                Colonoscopy Indications:              Screening for colorectal malignant neoplasm Providers:                Norvel Richards, MD, Lambert Mody,                            Kristine L. Risa Grill, Technician Referring MD:              Medicines:                Propofol per Anesthesia Complications:            No immediate complications. Estimated Blood Loss:     Estimated blood loss: none. Procedure:                Pre-Anesthesia Assessment:                           - Prior to the procedure, a History and Physical                            was performed, and patient medications and                            allergies were reviewed. The patient's tolerance of                            previous anesthesia was also reviewed. The risks                            and benefits of the procedure and the sedation                            options and risks were discussed with the patient.                            All questions were answered, and informed consent                            was obtained. Prior Anticoagulants: The patient has                            taken no previous anticoagulant or antiplatelet                            agents. ASA Grade Assessment: II - A patient with                            mild systemic disease. After reviewing the risks  and benefits, the patient was deemed in                            satisfactory condition to undergo the procedure.                           After obtaining informed consent, the colonoscope                            was passed under direct vision. Throughout the                            procedure, the patient's blood pressure, pulse, and                             oxygen saturations were monitored continuously. The                            [id number]) scope was introduced through the                            anus and advanced to the the cecum, identified by                            appendiceal orifice and ileocecal valve. The                            colonoscopy was performed without difficulty. The                            patient tolerated the procedure well. The quality                            of the bowel preparation was adequate. Scope In: 10:15:11 AM Scope Out: 10:27:36 AM Scope Withdrawal Time: 0 hours 7 minutes 0 seconds  Total Procedure Duration: 0 hours 12 minutes 25 seconds  Findings:      The perianal and digital rectal examinations were normal.      The colon (entire examined portion) appeared normal.      Non-bleeding internal hemorrhoids were found during retroflexion. The       hemorrhoids were mild, small and Grade I (internal hemorrhoids that do       not prolapse). Impression:               - The entire examined colon is normal.                           - Non-bleeding internal hemorrhoids.                           - No specimens collected. Moderate Sedation:      Moderate (conscious) sedation was personally administered by an       anesthesia professional. The following parameters were monitored: oxygen       saturation, heart rate, blood pressure, respiratory rate, EKG, adequacy  of pulmonary ventilation, and response to care. Recommendation:           - Patient has a contact number available for                            emergencies. The signs and symptoms of potential                            delayed complications were discussed with the                            patient. Return to normal activities tomorrow.                            Written discharge instructions were provided to the                            patient.                           - Advance diet as tolerated.                            - Continue present medications.                           - Patient has a contact number available for                            emergencies. The signs and symptoms of potential                            delayed complications were discussed with the                            patient. Return to normal activities tomorrow.                            Written discharge instructions were provided to the                            patient.                           - No repeat colonoscopy due to current age (56                            years or older).                           - Return to GI clinic PRN. Procedure Code(s):        --- Professional ---                           [phone], Colonoscopy, flexible; diagnostic, including  collection of specimen(s) by brushing or washing,                            when performed (separate procedure) Diagnosis Code(s):        --- Professional ---                           Z12.11, Encounter for screening for malignant                            neoplasm of colon                           K64.0, First degree hemorrhoids CPT copyright 2019 American Medical Association. All rights reserved. The codes documented in this report are preliminary and upon coder review may  be revised to meet current compliance requirements. [name]. [name], MD Norvel Richards, MD 06/28/2021 10:46:15 AM This report has been signed electronically. Number of Addenda: 0

## 2021-06-28 NOTE — Anesthesia Postprocedure Evaluation (Signed)
Anesthesia Post Note  Patient: Jeremy Sweeney  Procedure(s) Performed: COLONOSCOPY WITH PROPOFOL  Patient location during evaluation: Endoscopy Anesthesia Type: General Level of consciousness: awake and alert and oriented Pain management: pain level controlled Vital Signs Assessment: post-procedure vital signs reviewed and stable Respiratory status: spontaneous breathing, nonlabored ventilation and respiratory function stable Cardiovascular status: blood pressure returned to baseline and stable Postop Assessment: no apparent nausea or vomiting Anesthetic complications: no   No notable events documented.   Last Vitals:  Vitals:   06/28/21 1030 06/28/21 1037  BP: (!) 119/52 124/72  Pulse: 72 80  Resp: 13 17  Temp: 36.5 C 36.5 C  SpO2: 94% 97%    Last Pain:  Vitals:   06/28/21 1037  TempSrc: Axillary  PainSc: 0-No pain                 Elic Vencill C Jaquelynn Wanamaker

## 2021-06-28 NOTE — Anesthesia Preprocedure Evaluation (Addendum)
Anesthesia Evaluation  Patient identified by MRN, date of birth, ID band Patient awake    Reviewed: Allergy & Precautions, NPO status , Patient's Chart, lab work & pertinent test results  History of Anesthesia Complications Negative for: history of anesthetic complications  Airway Mallampati: II  TM Distance: >3 FB Neck ROM: Full    Dental  (+) Dental Advisory Given, Missing   Pulmonary former smoker,    Pulmonary exam normal breath sounds clear to auscultation       Cardiovascular Exercise Tolerance: Good hypertension, Pt. on medications Normal cardiovascular exam Rhythm:Regular Rate:Normal     Neuro/Psych negative neurological ROS     GI/Hepatic negative GI ROS, Neg liver ROS,   Endo/Other  negative endocrine ROS  Renal/GU negative Renal ROS     Musculoskeletal negative musculoskeletal ROS (+)   Abdominal   Peds  Hematology negative hematology ROS (+)   Anesthesia Other Findings   Reproductive/Obstetrics negative OB ROS                            Anesthesia Physical Anesthesia Plan  ASA: 2  Anesthesia Plan: General   Post-op Pain Management: Minimal or no pain anticipated   Induction: Intravenous  PONV Risk Score and Plan: TIVA  Airway Management Planned: Nasal Cannula and Natural Airway  Additional Equipment:   Intra-op Plan:   Post-operative Plan:   Informed Consent: I have reviewed the patients History and Physical, chart, labs and discussed the procedure including the risks, benefits and alternatives for the proposed anesthesia with the patient or authorized representative who has indicated his/her understanding and acceptance.     Dental advisory given  Plan Discussed with: CRNA and Surgeon  Anesthesia Plan Comments:         Anesthesia Quick Evaluation

## 2021-06-28 NOTE — Transfer of Care (Signed)
Immediate Anesthesia Transfer of Care Note  Patient: Jeremy Sweeney  Procedure(s) Performed: COLONOSCOPY WITH PROPOFOL  Patient Location: Endoscopy Unit  Anesthesia Type:General  Level of Consciousness: drowsy  Airway & Oxygen Therapy: Patient Spontanous Breathing  Post-op Assessment: Report given to RN and Post -op Vital signs reviewed and stable  Post vital signs: Reviewed and stable  Last Vitals:  Vitals Value Taken Time  BP    Temp    Pulse    Resp    SpO2      Last Pain:  Vitals:   06/28/21 1011  TempSrc:   PainSc: 0-No pain         Complications: No notable events documented.

## 2021-06-28 NOTE — H&P (Signed)
@LOGO @   Primary Care Physician:  Asencion Noble, MD Primary Gastroenterologist:  Dr. Gala Romney  Pre-Procedure History & Physical: HPI:  Jeremy Sweeney is a 71 y.o. male is here for a screening colonoscopy.  No bowel symptoms.  Colonoscopy for Hemoccult positive stool 2012 yielded hemorrhoids left-sided diverticulosis and a couple of hyperplastic polyps.  No GI symptoms at this time.  No family history of colon cancer.  Past Medical History:  Diagnosis Date   Hypercholesteremia    Hypertension     Past Surgical History:  Procedure Laterality Date   COLONOSCOPY  04/20/2011   Procedure: COLONOSCOPY;  Surgeon: Daneil Dolin, MD;  Location: [facility];  Service: Endoscopy;  Laterality: N/A;  2:15 PM   CYSTOSCOPY WITH STENT PLACEMENT Left 11/11/2015   Procedure: CYSTOSCOPY WITH STENT PLACEMENT;  Surgeon: Cleon Gustin, MD;  Location: [facility];  Service: Urology;  Laterality: Left;   CYSTOSCOPY/RETROGRADE/URETEROSCOPY/STONE EXTRACTION WITH BASKET Left 11/11/2015   Procedure: CYSTOSCOPY/LEFT RETROGRADE PYELOGRAM, LEFT URETEROSCOPY, LEFT URETERAL STONE EXTRACTION WITH BASKET;  Surgeon: Cleon Gustin, MD;  Location: [facility];  Service: Urology;  Laterality: Left;   SHOULDER CLOSED REDUCTION  2001    Prior to Admission medications   Medication Sig Start Date End Date Taking? Authorizing Provider  amLODipine (NORVASC) 5 MG tablet Take 5 mg by mouth every evening. 05/14/21  Yes [provider]  aspirin EC 81 MG tablet Take 81 mg by mouth every evening.   Yes [provider]  losartan (COZAAR) 100 MG tablet Take 100 mg by mouth every evening.   Yes [provider]  simvastatin (ZOCOR) 10 MG tablet Take 10 mg by mouth at bedtime.     Yes [provider]    Allergies as of 06/22/2021   (No Known Allergies)    Family History  Problem Relation Age of Onset   Colon cancer Neg Hx     Social History   Socioeconomic History   Marital status: Married     Spouse name: Not on file   Number of children: Not on file   Years of education: Not on file   Highest education level: Not on file  Occupational History   Not on file  Tobacco Use   Smoking status: Former    Packs/day: 0.50    Years: 40.00    Pack years: 20.00    Types: Cigarettes    Quit date: 11/08/2008    Years since quitting: 12.6   Smokeless tobacco: Not on file  Vaping Use   Vaping Use: Never used  Substance and Sexual Activity   Alcohol use: Yes    Comment: occassional   Drug use: No   Sexual activity: Yes    Birth control/protection: None  Other Topics Concern   Not on file  Social History Narrative   Not on file   Social Determinants of Health   Financial Resource Strain: Not on file  Food Insecurity: Not on file  Transportation Needs: Not on file  Physical Activity: Not on file  Stress: Not on file  Social Connections: Not on file  Intimate Partner Violence: Not on file    Review of Systems: See HPI, otherwise negative ROS  Physical Exam: BP (!) 148/87    Pulse 94    Temp 97.9 F (36.6 C) (Oral)    Resp 19    SpO2 97%  General:   Alert,  Well-developed, well-nourished, pleasant and cooperative in NAD Head:  Normocephalic and atraumatic. Lungs:  Clear throughout to auscultation.   No wheezes, crackles, or rhonchi. No acute distress. Heart:  Regular rate and rhythm; no murmurs, clicks, rubs,  or gallops. Abdomen:  Soft, nontender and nondistended. No masses, hepatosplenomegaly or hernias noted. Normal bowel sounds, without guarding, and without rebound.   Impression/Plan: Jeremy Sweeney is now here to undergo a screening colonoscopy.  Average risk screening examination.  Risks, benefits, limitations, imponderables and alternatives regarding colonoscopy have been reviewed with the patient. Questions have been answered. All parties agreeable.     Notice:  This dictation was prepared with Dragon dictation along with smaller phrase technology. Any  transcriptional errors that result from this process are unintentional and may not be corrected upon review.

## 2021-06-30 ENCOUNTER — Encounter (HOSPITAL_COMMUNITY): Payer: Self-pay | Admitting: Internal Medicine

## 2021-10-26 DIAGNOSIS — I10 Essential (primary) hypertension: Secondary | ICD-10-CM | POA: Diagnosis not present

## 2021-10-26 DIAGNOSIS — Z79899 Other long term (current) drug therapy: Secondary | ICD-10-CM | POA: Diagnosis not present

## 2021-10-26 DIAGNOSIS — I700 Atherosclerosis of aorta: Secondary | ICD-10-CM | POA: Diagnosis not present

## 2021-10-26 DIAGNOSIS — E785 Hyperlipidemia, unspecified: Secondary | ICD-10-CM | POA: Diagnosis not present

## 2021-11-12 DIAGNOSIS — I700 Atherosclerosis of aorta: Secondary | ICD-10-CM | POA: Diagnosis not present

## 2021-11-12 DIAGNOSIS — N1831 Chronic kidney disease, stage 3a: Secondary | ICD-10-CM | POA: Diagnosis not present

## 2021-11-12 DIAGNOSIS — I10 Essential (primary) hypertension: Secondary | ICD-10-CM | POA: Diagnosis not present

## 2022-02-10 DIAGNOSIS — H524 Presbyopia: Secondary | ICD-10-CM | POA: Diagnosis not present

## 2022-02-10 DIAGNOSIS — H2513 Age-related nuclear cataract, bilateral: Secondary | ICD-10-CM | POA: Diagnosis not present

## 2022-04-29 DIAGNOSIS — E785 Hyperlipidemia, unspecified: Secondary | ICD-10-CM | POA: Diagnosis not present

## 2022-04-29 DIAGNOSIS — Z79899 Other long term (current) drug therapy: Secondary | ICD-10-CM | POA: Diagnosis not present

## 2022-04-29 DIAGNOSIS — R7303 Prediabetes: Secondary | ICD-10-CM | POA: Diagnosis not present

## 2022-04-29 DIAGNOSIS — I700 Atherosclerosis of aorta: Secondary | ICD-10-CM | POA: Diagnosis not present

## 2022-04-29 DIAGNOSIS — I2584 Coronary atherosclerosis due to calcified coronary lesion: Secondary | ICD-10-CM | POA: Diagnosis not present

## 2022-04-29 DIAGNOSIS — Z125 Encounter for screening for malignant neoplasm of prostate: Secondary | ICD-10-CM | POA: Diagnosis not present

## 2022-04-29 DIAGNOSIS — I10 Essential (primary) hypertension: Secondary | ICD-10-CM | POA: Diagnosis not present

## 2022-04-29 DIAGNOSIS — I4510 Unspecified right bundle-branch block: Secondary | ICD-10-CM | POA: Diagnosis not present

## 2022-05-06 DIAGNOSIS — I10 Essential (primary) hypertension: Secondary | ICD-10-CM | POA: Diagnosis not present

## 2022-05-06 DIAGNOSIS — R7309 Other abnormal glucose: Secondary | ICD-10-CM | POA: Diagnosis not present

## 2022-05-06 DIAGNOSIS — I700 Atherosclerosis of aorta: Secondary | ICD-10-CM | POA: Diagnosis not present

## 2022-05-06 DIAGNOSIS — N1831 Chronic kidney disease, stage 3a: Secondary | ICD-10-CM | POA: Diagnosis not present

## 2022-05-06 DIAGNOSIS — I4510 Unspecified right bundle-branch block: Secondary | ICD-10-CM | POA: Diagnosis not present

## 2022-11-03 DIAGNOSIS — I10 Essential (primary) hypertension: Secondary | ICD-10-CM | POA: Diagnosis not present

## 2022-11-03 DIAGNOSIS — R7303 Prediabetes: Secondary | ICD-10-CM | POA: Diagnosis not present

## 2022-11-03 DIAGNOSIS — N1831 Chronic kidney disease, stage 3a: Secondary | ICD-10-CM | POA: Diagnosis not present

## 2022-11-03 DIAGNOSIS — Z79899 Other long term (current) drug therapy: Secondary | ICD-10-CM | POA: Diagnosis not present

## 2022-11-10 DIAGNOSIS — R7309 Other abnormal glucose: Secondary | ICD-10-CM | POA: Diagnosis not present

## 2022-11-10 DIAGNOSIS — I10 Essential (primary) hypertension: Secondary | ICD-10-CM | POA: Diagnosis not present

## 2023-05-29 DIAGNOSIS — R001 Bradycardia, unspecified: Secondary | ICD-10-CM | POA: Diagnosis not present

## 2023-05-29 DIAGNOSIS — Z79899 Other long term (current) drug therapy: Secondary | ICD-10-CM | POA: Diagnosis not present

## 2023-05-29 DIAGNOSIS — N1831 Chronic kidney disease, stage 3a: Secondary | ICD-10-CM | POA: Diagnosis not present

## 2023-05-29 DIAGNOSIS — I10 Essential (primary) hypertension: Secondary | ICD-10-CM | POA: Diagnosis not present

## 2023-05-29 DIAGNOSIS — R7303 Prediabetes: Secondary | ICD-10-CM | POA: Diagnosis not present

## 2023-05-29 DIAGNOSIS — Z125 Encounter for screening for malignant neoplasm of prostate: Secondary | ICD-10-CM | POA: Diagnosis not present

## 2023-05-29 DIAGNOSIS — I700 Atherosclerosis of aorta: Secondary | ICD-10-CM | POA: Diagnosis not present

## 2023-06-02 DIAGNOSIS — I10 Essential (primary) hypertension: Secondary | ICD-10-CM | POA: Diagnosis not present

## 2023-06-02 DIAGNOSIS — Z0001 Encounter for general adult medical examination with abnormal findings: Secondary | ICD-10-CM | POA: Diagnosis not present

## 2023-06-02 DIAGNOSIS — E1169 Type 2 diabetes mellitus with other specified complication: Secondary | ICD-10-CM | POA: Diagnosis not present

## 2023-06-02 DIAGNOSIS — I4510 Unspecified right bundle-branch block: Secondary | ICD-10-CM | POA: Diagnosis not present

## 2023-08-28 DIAGNOSIS — E119 Type 2 diabetes mellitus without complications: Secondary | ICD-10-CM | POA: Diagnosis not present

## 2023-09-04 DIAGNOSIS — I10 Essential (primary) hypertension: Secondary | ICD-10-CM | POA: Diagnosis not present

## 2023-09-04 DIAGNOSIS — E1169 Type 2 diabetes mellitus with other specified complication: Secondary | ICD-10-CM | POA: Diagnosis not present

## 2024-01-01 DIAGNOSIS — N1831 Chronic kidney disease, stage 3a: Secondary | ICD-10-CM | POA: Diagnosis not present

## 2024-01-01 DIAGNOSIS — R001 Bradycardia, unspecified: Secondary | ICD-10-CM | POA: Diagnosis not present

## 2024-01-01 DIAGNOSIS — I10 Essential (primary) hypertension: Secondary | ICD-10-CM | POA: Diagnosis not present

## 2024-01-01 DIAGNOSIS — Z79899 Other long term (current) drug therapy: Secondary | ICD-10-CM | POA: Diagnosis not present

## 2024-01-01 DIAGNOSIS — E119 Type 2 diabetes mellitus without complications: Secondary | ICD-10-CM | POA: Diagnosis not present

## 2024-01-01 DIAGNOSIS — R7303 Prediabetes: Secondary | ICD-10-CM | POA: Diagnosis not present

## 2024-01-08 DIAGNOSIS — N1831 Chronic kidney disease, stage 3a: Secondary | ICD-10-CM | POA: Diagnosis not present

## 2024-01-08 DIAGNOSIS — I10 Essential (primary) hypertension: Secondary | ICD-10-CM | POA: Diagnosis not present

## 2024-01-08 DIAGNOSIS — E1169 Type 2 diabetes mellitus with other specified complication: Secondary | ICD-10-CM | POA: Diagnosis not present

## 2024-03-06 ENCOUNTER — Encounter: Payer: Self-pay | Admitting: *Deleted

## 2024-03-06 NOTE — Progress Notes (Signed)
 NAVIN DOGAN                                          MRN: [id number]   03/06/2024   The [facility] Quality Team Specialist reviewed this patient medical record for the purposes of chart review for care gap closure. The following were reviewed: chart review for care gap closure-kidney health evaluation for diabetes:eGFR  and uACR.    [facility] Quality Team
